# Patient Record
Sex: Female | Born: 1985 | Hispanic: No | Marital: Single | Smoking: Never smoker
Health system: Southern US, Community
[De-identification: ages and names within clinical notes are randomized; demographics above are authoritative.]

## PROBLEM LIST (undated history)

## (undated) HISTORY — PX: ESOPHAGOGASTRODUODENOSCOPY ENDOSCOPY: SHX5814

## (undated) HISTORY — PX: OTHER SURGICAL HISTORY: SHX169

---

## 2014-11-08 ENCOUNTER — Inpatient Hospital Stay (HOSPITAL_COMMUNITY)
Admission: EM | Admit: 2014-11-08 | Discharge: 2014-11-12 | DRG: 920 | Disposition: A | Discharge status: Home / Self Care | Payer: Self-pay | Attending: Internal Medicine | Admitting: Internal Medicine

## 2014-11-08 ENCOUNTER — Encounter (HOSPITAL_COMMUNITY): Payer: Self-pay

## 2014-11-08 ENCOUNTER — Emergency Department (HOSPITAL_COMMUNITY): Payer: Self-pay

## 2014-11-08 DIAGNOSIS — Z79899 Other long term (current) drug therapy: Secondary | ICD-10-CM

## 2014-11-08 DIAGNOSIS — R112 Nausea with vomiting, unspecified: Secondary | ICD-10-CM | POA: Diagnosis present

## 2014-11-08 DIAGNOSIS — D649 Anemia, unspecified: Secondary | ICD-10-CM | POA: Diagnosis present

## 2014-11-08 DIAGNOSIS — K259 Gastric ulcer, unspecified as acute or chronic, without hemorrhage or perforation: Secondary | ICD-10-CM | POA: Diagnosis present

## 2014-11-08 DIAGNOSIS — T85598A Other mechanical complication of other gastrointestinal prosthetic devices, implants and grafts, initial encounter: Principal | ICD-10-CM | POA: Diagnosis present

## 2014-11-08 DIAGNOSIS — Z6825 Body mass index (BMI) 25.0-25.9, adult: Secondary | ICD-10-CM

## 2014-11-08 DIAGNOSIS — R1013 Epigastric pain: Secondary | ICD-10-CM | POA: Diagnosis present

## 2014-11-08 DIAGNOSIS — Y838 Other surgical procedures as the cause of abnormal reaction of the patient, or of later complication, without mention of misadventure at the time of the procedure: Secondary | ICD-10-CM | POA: Diagnosis present

## 2014-11-08 DIAGNOSIS — R109 Unspecified abdominal pain: Secondary | ICD-10-CM | POA: Diagnosis present

## 2014-11-08 DIAGNOSIS — K4090 Unilateral inguinal hernia, without obstruction or gangrene, not specified as recurrent: Secondary | ICD-10-CM | POA: Diagnosis present

## 2014-11-08 DIAGNOSIS — Q600 Renal agenesis, unilateral: Secondary | ICD-10-CM

## 2014-11-08 LAB — URINALYSIS, ROUTINE W REFLEX MICROSCOPIC
Glucose, UA: NEGATIVE mg/dL
Hgb urine dipstick: NEGATIVE
KETONES UR: 40 mg/dL — AB
LEUKOCYTES UA: NEGATIVE
NITRITE: NEGATIVE
PROTEIN: NEGATIVE mg/dL
Specific Gravity, Urine: 1.022 (ref 1.005–1.030)
Urobilinogen, UA: 1 mg/dL (ref 0.0–1.0)
pH: 5.5 (ref 5.0–8.0)

## 2014-11-08 LAB — HEPATIC FUNCTION PANEL
ALBUMIN: 4 g/dL (ref 3.5–5.0)
ALK PHOS: 105 U/L (ref 38–126)
ALT: 12 U/L — AB (ref 14–54)
AST: 16 U/L (ref 15–41)
Bilirubin, Direct: 0.1 mg/dL (ref 0.1–0.5)
Indirect Bilirubin: 0.6 mg/dL (ref 0.3–0.9)
TOTAL PROTEIN: 7.6 g/dL (ref 6.5–8.1)
Total Bilirubin: 0.7 mg/dL (ref 0.3–1.2)

## 2014-11-08 LAB — I-STAT CHEM 8, ED
BUN: 9 mg/dL (ref 6–20)
CHLORIDE: 103 mmol/L (ref 101–111)
Calcium, Ion: 1.14 mmol/L (ref 1.12–1.23)
Creatinine, Ser: 0.9 mg/dL (ref 0.44–1.00)
Glucose, Bld: 86 mg/dL (ref 65–99)
HEMATOCRIT: 48 % — AB (ref 36.0–46.0)
HEMOGLOBIN: 16.3 g/dL — AB (ref 12.0–15.0)
POTASSIUM: 4.1 mmol/L (ref 3.5–5.1)
Sodium: 140 mmol/L (ref 135–145)
TCO2: 23 mmol/L (ref 0–100)

## 2014-11-08 LAB — CBC
HCT: 43.7 % (ref 36.0–46.0)
HEMOGLOBIN: 15.3 g/dL — AB (ref 12.0–15.0)
MCH: 28.3 pg (ref 26.0–34.0)
MCHC: 35 g/dL (ref 30.0–36.0)
MCV: 80.8 fL (ref 78.0–100.0)
PLATELETS: 298 10*3/uL (ref 150–400)
RBC: 5.41 MIL/uL — AB (ref 3.87–5.11)
RDW: 12.4 % (ref 11.5–15.5)
WBC: 5.8 10*3/uL (ref 4.0–10.5)

## 2014-11-08 LAB — I-STAT BETA HCG BLOOD, ED (MC, WL, AP ONLY): I-stat hCG, quantitative: <5 m[IU]/mL (ref <5)

## 2014-11-08 LAB — LIPASE, BLOOD: Lipase: 16 U/L — ABNORMAL LOW (ref 22–51)

## 2014-11-08 MED ORDER — ONDANSETRON HCL 4 MG/2ML IJ SOLN
4.0000 mg | Freq: Once | INTRAMUSCULAR | Status: AC
  Administered 2014-11-08: 4 mg via INTRAVENOUS
  Filled 2014-11-08: qty 2

## 2014-11-08 MED ORDER — IOHEXOL 300 MG/ML  SOLN
100.0000 mL | Freq: Once | INTRAMUSCULAR | Status: AC | PRN
  Administered 2014-11-08: 100 mL via INTRAVENOUS

## 2014-11-08 MED ORDER — SODIUM CHLORIDE 0.9 % IV BOLUS (SEPSIS)
1000.0000 mL | Freq: Once | INTRAVENOUS | Status: AC
  Administered 2014-11-08: 1000 mL via INTRAVENOUS

## 2014-11-08 MED ORDER — MORPHINE SULFATE (PF) 4 MG/ML IV SOLN
4.0000 mg | Freq: Once | INTRAVENOUS | Status: AC
  Administered 2014-11-08: 4 mg via INTRAVENOUS
  Filled 2014-11-08: qty 1

## 2014-11-08 NOTE — ED Notes (Signed)
Unsuccessful at getting labs 

## 2014-11-08 NOTE — ED Notes (Signed)
Nurse starting IV 

## 2014-11-08 NOTE — ED Notes (Addendum)
Patient reports that she has a gastric balloon for weight loss. Patient states she has had epigastric pain and nausea x 2 weeks, but severe over the past 4 days.

## 2014-11-08 NOTE — ED Provider Notes (Signed)
CSN: [id number]     Arrival date & time 11/08/14  1820 History   First MD Initiated Contact with Patient 11/08/14 2000     Chief Complaint  Patient presents with  . Abdominal Pain     (Consider location/radiation/quality/duration/timing/severity/associated sxs/prior Treatment) HPI   29 year old female with history of prior gastric balloon for weight loss presenting for evaluation of abdominal pain. Patient reports she had gastric balloon procedure out of state at [place] 2 years ago for weight loss. She was told that if she has any abdominal pain then she may need to have her balloon removed. For the past 2 weeks patient has reports gradual onset of persistent sharp achy pain to the epigastrium, nonradiating, with associate nausea and vomiting. Vomitus usually after eating or drinking. For the past 4 days her symptoms has been getting progressively worse. She is unable to eat or drink anything. She denies having fever, chills, headache, chest pain, shortness of breath, productive cough, hemoptysis, back pain, dysuria, vaginal bleeding, vaginal discharge, diarrhea, or rash. She is not a drinker and denies history of diabetes. Her pain is currently moderate in intensity. She has tried taking pantoprazole, and metoclopramide in for her symptoms without adequate relief.  History reviewed. No pertinent past medical history. Past Surgical History  Procedure Laterality Date  . Gastric balloon    . Esophagogastroduodenoscopy endoscopy     History reviewed. No pertinent family history. Social History  Substance Use Topics  . Smoking status: Never Smoker   . Smokeless tobacco: Never Used  . Alcohol Use: No   OB History    No data available     Review of Systems  All other systems reviewed and are negative.     Allergies  Review of patient's allergies indicates no known allergies.  Home Medications   Prior to Admission medications   Medication Sig Start Date End Date Taking?  Authorizing Provider  metoCLOPramide (REGLAN) 10 MG tablet Take 10 mg by mouth every 6 (six) hours as needed for nausea or vomiting.   Yes Historical Provider, MD  pantoprazole (PROTONIX) 20 MG tablet Take 20 mg by mouth daily.   Yes Historical Provider, MD   BP 143/92 mmHg  Pulse 87  Temp(Src) 98.3 F (36.8 C) (Oral)  Resp 18  Ht 5' 3.78" (1.62 m)  Wt 145 lb 8.1 oz (66 kg)  BMI 25.15 kg/m2  SpO2 100%  LMP 11/04/2014 Physical Exam  Constitutional: She appears well-developed and well-nourished. No distress.  HENT:  Head: Atraumatic.  Eyes: Conjunctivae are normal.  Neck: Neck supple.  Cardiovascular: Normal rate and regular rhythm.   Pulmonary/Chest: Effort normal and breath sounds normal. She exhibits no tenderness.  Abdominal: Soft. There is tenderness (Tenderness to epigastric without guarding or rebound tenderness. Otherwise abdomen is soft, bowel sounds present. Negative Murphy sign, no pain at McBurney's point.).  Neurological: She is alert.  Skin: No rash noted.  Psychiatric: She has a normal mood and affect.  Nursing note and vitals reviewed.   ED Course  Procedures (including critical care time)  Epigastric abdominal pain likely secondary to a gastric balloon. Workup initiated, CT scan ordered. Anticipate consultation with GI for further management.  12:37 AM Coronal and pelvis CT scan demonstrated a marked distention of the gastric balloon with distention of the stomach and mass effect on the adjacent small bowel and mesentery. No evidence of obstruction noted. Patient continues to endorse pain minimally improved after receiving morphine. Additional pain medication given. The remainder of her  labs are reassuring. Plan to consult GI for further management.  12:46 AM Appreciate consultation from GI Dr. Elnoria Howard who recommend consulting General Surgery for further management as he has never remove gastric balloon in the past and unable to assist.  Will consult General Surgery.     1:04 AM I have consulted with general surgeon Dr. Ricky Stabs, who plan to discussed with his partner Dr. Daphine Deutscher in the morning to explore options of removal of the balloon. Plan to consult medicine for admission.    1:19 AM i have consulted with [facility] Dr. Toniann Fail who agrees to admit pt to med surg for pain management.  Dr. Georgana Curio will see and evaluate pt and determine further care.   Labs Review Labs Reviewed  CBC - Abnormal; Notable for the following:    RBC 5.41 (*)    Hemoglobin 15.3 (*)    All other components within normal limits  URINALYSIS, ROUTINE W REFLEX MICROSCOPIC (NOT AT [facility]) - Abnormal; Notable for the following:    Color, Urine AMBER (*)    Bilirubin Urine SMALL (*)    Ketones, ur 40 (*)    All other components within normal limits  HEPATIC FUNCTION PANEL - Abnormal; Notable for the following:    ALT 12 (*)    All other components within normal limits  LIPASE, BLOOD - Abnormal; Notable for the following:    Lipase 16 (*)    All other components within normal limits  I-STAT CHEM 8, ED - Abnormal; Notable for the following:    Hemoglobin 16.3 (*)    HCT 48.0 (*)    All other components within normal limits  I-STAT BETA HCG BLOOD, ED ([facility], [facility], [facility] ONLY)    Imaging Review Ct Abdomen Pelvis W Contrast  11/09/2014   CLINICAL DATA:  29 year old female. Epigastric pain and nausea x2 weeks. Patient reports he gastric balloon for weight loss.  EXAM: CT ABDOMEN AND PELVIS WITH CONTRAST  TECHNIQUE: Multidetector CT imaging of the abdomen and pelvis was performed using the standard protocol following bolus administration of intravenous contrast.  CONTRAST:  OMNIPAQUE IOHEXOL 300 MG/ML  SOLN  COMPARISON:  None.  FINDINGS: The visualized lung bases are clear. No intra-abdominal free air. Trace free fluid within the pelvis.  The liver, gallbladder, pancreas, spleen, adrenal glands appear unremarkable. 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  The right kidney is mildly enlarged with lobulated contour. This may be related to compensatory hypertrophy. The crossed fused ectopia is less likely. There is mild right hydronephrosis. The visualized right ureter appears unremarkable. 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 The urinary bladder is unremarkable. The uterus is not visualized and may be surgically absent.  A gastric balloon is noted within the distal stomach. Contrast however is noted passing through the stomach into the small bowel without evidence of obstruction. There is marked distention of the gastric balloon with air fluid level. There is distention of the stomach with compression of the adjacent small bowel and mesenteric vasculature. Copious amount of stool noted throughout the colon. The appendix is unremarkable.  The visualized abdominal aorta and IVC appear unremarkable. No portal venous gas identified. There is no lymphadenopathy. An ovoid soft tissue density containing interspersed fat noted extending into the right inguinal canal. There is no definite associated inflammatory changes. Ultrasound may provide better evaluation of this tissue and right inguinal hernia.  Midline vertical anterior pelvic  wall incisional scar is noted. There is mild diffuse subcutaneous soft tissue stranding. The osseous structures are grossly unremarkable.  IMPRESSION: Marked distention of the gastric balloon with distention of the stomach and mass-effect on the adjacent small bowel and mesentery. No evidence of bowel obstruction. Constipation. Normal appendix.  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 There is mild fullness of the right renal collecting system and pelvis.  Right inguinal hernia containing an ovoid soft tissue with interspersed fat. Correlation with clinical exam and ultrasound recommended.   Electronically Signed   By:  Elgie Collard M.D.   On: 11/09/2014 00:06   I have personally reviewed and evaluated these images and lab results as part of my medical decision-making.   EKG Interpretation None      MDM   Final diagnoses:  Epigastric abdominal pain    BP 126/73 mmHg  Pulse 61  Temp(Src) 98.3 F (36.8 C) (Oral)  Resp 14  Ht 5' 3.78" (1.62 m)  Wt 145 lb 8.1 oz (66 kg)  BMI 25.15 kg/m2  SpO2 100%  LMP 11/04/2014     Fayrene Helper, PA-C 11/09/14 0120  Donnetta Hutching, MD 11/11/14 [phone]

## 2014-11-09 ENCOUNTER — Encounter (HOSPITAL_COMMUNITY): Payer: Self-pay | Admitting: *Deleted

## 2014-11-09 ENCOUNTER — Other Ambulatory Visit: Payer: Self-pay | Admitting: General Surgery

## 2014-11-09 DIAGNOSIS — R1013 Epigastric pain: Secondary | ICD-10-CM | POA: Diagnosis present

## 2014-11-09 DIAGNOSIS — R109 Unspecified abdominal pain: Secondary | ICD-10-CM | POA: Diagnosis present

## 2014-11-09 LAB — CBC WITH DIFFERENTIAL/PLATELET
BASOS ABS: 0 10*3/uL (ref 0.0–0.1)
BASOS PCT: 0 %
EOS ABS: 0.1 10*3/uL (ref 0.0–0.7)
Eosinophils Relative: 2 %
HEMATOCRIT: 38 % (ref 36.0–46.0)
HEMOGLOBIN: 12.8 g/dL (ref 12.0–15.0)
Lymphocytes Relative: 57 %
Lymphs Abs: 3.4 10*3/uL (ref 0.7–4.0)
MCH: 27.4 pg (ref 26.0–34.0)
MCHC: 33.7 g/dL (ref 30.0–36.0)
MCV: 81.4 fL (ref 78.0–100.0)
MONOS PCT: 6 %
Monocytes Absolute: 0.4 10*3/uL (ref 0.1–1.0)
NEUTROS ABS: 2 10*3/uL (ref 1.7–7.7)
NEUTROS PCT: 34 %
Platelets: 288 10*3/uL (ref 150–400)
RBC: 4.67 MIL/uL (ref 3.87–5.11)
RDW: 12.3 % (ref 11.5–15.5)
WBC: 5.9 10*3/uL (ref 4.0–10.5)

## 2014-11-09 LAB — COMPREHENSIVE METABOLIC PANEL
ALK PHOS: 83 U/L (ref 38–126)
ALT: 9 U/L — AB (ref 14–54)
ANION GAP: 7 (ref 5–15)
AST: 10 U/L — ABNORMAL LOW (ref 15–41)
Albumin: 3.1 g/dL — ABNORMAL LOW (ref 3.5–5.0)
BILIRUBIN TOTAL: 0.8 mg/dL (ref 0.3–1.2)
BUN: 9 mg/dL (ref 6–20)
CALCIUM: 8.6 mg/dL — AB (ref 8.9–10.3)
CO2: 24 mmol/L (ref 22–32)
CREATININE: 0.82 mg/dL (ref 0.44–1.00)
Chloride: 108 mmol/L (ref 101–111)
Glucose, Bld: 92 mg/dL (ref 65–99)
Potassium: 3.9 mmol/L (ref 3.5–5.1)
SODIUM: 139 mmol/L (ref 135–145)
TOTAL PROTEIN: 5.9 g/dL — AB (ref 6.5–8.1)

## 2014-11-09 LAB — GLUCOSE, CAPILLARY
GLUCOSE-CAPILLARY: 83 mg/dL (ref 65–99)
GLUCOSE-CAPILLARY: 84 mg/dL (ref 65–99)

## 2014-11-09 MED ORDER — ONDANSETRON HCL 4 MG PO TABS: 4.0000 mg | ORAL_TABLET | Freq: Four times a day (QID) | ORAL | Status: DC | PRN

## 2014-11-09 MED ORDER — MORPHINE SULFATE (PF) 4 MG/ML IV SOLN
4.0000 mg | Freq: Once | INTRAVENOUS | Status: AC
  Administered 2014-11-09: 4 mg via INTRAVENOUS
  Filled 2014-11-09: qty 1

## 2014-11-09 MED ORDER — ONDANSETRON HCL 4 MG/2ML IJ SOLN: 4.0000 mg | Freq: Three times a day (TID) | INTRAMUSCULAR | Status: DC | PRN

## 2014-11-09 MED ORDER — ACETAMINOPHEN 325 MG PO TABS
650.0000 mg | ORAL_TABLET | Freq: Four times a day (QID) | ORAL | Status: DC | PRN
  Filled 2014-11-09: qty 2

## 2014-11-09 MED ORDER — PANTOPRAZOLE SODIUM 40 MG IV SOLR
40.0000 mg | Freq: Once | INTRAVENOUS | Status: AC
  Administered 2014-11-09: 40 mg via INTRAVENOUS
  Filled 2014-11-09: qty 40

## 2014-11-09 MED ORDER — ONDANSETRON HCL 4 MG/2ML IJ SOLN
4.0000 mg | Freq: Four times a day (QID) | INTRAMUSCULAR | Status: DC | PRN
  Administered 2014-11-09 – 2014-11-10 (×4): 4 mg via INTRAVENOUS
  Filled 2014-11-09 (×4): qty 2

## 2014-11-09 MED ORDER — MORPHINE SULFATE (PF) 2 MG/ML IV SOLN
1.0000 mg | INTRAVENOUS | Status: DC | PRN
  Administered 2014-11-09 – 2014-11-10 (×6): 1 mg via INTRAVENOUS
  Filled 2014-11-09 (×6): qty 1

## 2014-11-09 MED ORDER — ONDANSETRON HCL 4 MG PO TABS: 4.0000 mg | ORAL_TABLET | Freq: Three times a day (TID) | ORAL | Status: DC | PRN

## 2014-11-09 MED ORDER — PANTOPRAZOLE SODIUM 40 MG IV SOLR
40.0000 mg | INTRAVENOUS | Status: DC
  Administered 2014-11-09 – 2014-11-10 (×2): 40 mg via INTRAVENOUS
  Filled 2014-11-09 (×2): qty 40

## 2014-11-09 MED ORDER — HYDROCODONE-ACETAMINOPHEN 5-325 MG PO TABS: 1.0000 | ORAL_TABLET | Freq: Four times a day (QID) | ORAL | Status: DC | PRN

## 2014-11-09 MED ORDER — HYDROMORPHONE HCL 1 MG/ML IJ SOLN: 1.0000 mg | INTRAMUSCULAR | Status: DC | PRN

## 2014-11-09 MED ORDER — IBUPROFEN 800 MG PO TABS
800.0000 mg | ORAL_TABLET | Freq: Three times a day (TID) | ORAL | Status: DC
Start: 2014-11-09 — End: 2014-11-09

## 2014-11-09 MED ORDER — DEXTROSE-NACL 5-0.9 % IV SOLN
INTRAVENOUS | Status: AC
  Administered 2014-11-09 (×2): via INTRAVENOUS
  Administered 2014-11-10: 1000 mL via INTRAVENOUS

## 2014-11-09 MED ORDER — ACETAMINOPHEN 650 MG RE SUPP
650.0000 mg | Freq: Four times a day (QID) | RECTAL | Status: DC | PRN
  Administered 2014-11-10: 650 mg via RECTAL
  Filled 2014-11-09: qty 1

## 2014-11-09 NOTE — H&P (Signed)
[facility] History and Physical  Jessica Sexton ZOX:[id number] DOB: February 02, 1986 DOA: 11/08/2014  Referring physician: Mr. Greta Doom. PCP: No primary care provider on file.  Specialists: None.  Chief Complaint: Abdominal pain.  HPI: Jessica Sexton is a 29 y.o. female with no significant past medical history who has had previous history of placement of gastric balloon for obesity 2 years ago at [place] presents to the ER because of abdominal pain over the last 2 weeks which has gradually worsened. Pain is mostly in the epigastric area with nausea and vomiting and unable to eat much. CT abdomen and pelvis shows a large balloon compressing on adjacent structures. On-call surgeon Dr. Gerrit Friends was consulted by ER physician and will be seeing patient in consult. Patient's pain is improved with Dilaudid. Patient otherwise denies any diarrhea or fever chills.  Review of Systems: As presented in the history of presenting illness, rest negative.  History reviewed. No pertinent past medical history. Past Surgical History  Procedure Laterality Date  . Gastric balloon    . Esophagogastroduodenoscopy endoscopy     Social History:  reports that she has never smoked. She has never used smokeless tobacco. She reports that she does not drink alcohol or use illicit drugs. Where does patient live home. Can patient participate in ADLs? Yes.  No Known Allergies  Family History:  Family History  Problem Relation Age of Onset  . Diabetes Mellitus II Neg Hx   . Hypertension Neg Hx       Prior to Admission medications   Medication Sig Start Date End Date Taking? Authorizing Provider  metoCLOPramide (REGLAN) 10 MG tablet Take 10 mg by mouth every 6 (six) hours as needed for nausea or vomiting.   Yes Historical Provider, MD  pantoprazole (PROTONIX) 20 MG tablet Take 20 mg by mouth daily.   Yes Historical Provider, MD    Physical Exam: Filed Vitals:   11/08/14 1826 11/09/14 0055 11/09/14 0208  BP:  143/92 126/73 150/94  Pulse: 87 61 58  Temp: 98.3 F (36.8 C)  97.8 F (36.6 C)  TempSrc: Oral  Oral  Resp: Height: 5' 3.78" (1.62 m)   (1.6 m)  Weight: 66 kg (145 lb 8.1 oz)  71.5 kg (157 lb 10.1 oz)  SpO2: 100% 100% 100%     General:  Moderately built and nourished.  Eyes: Anicteric no pallor.  ENT: No discharge from the ears eyes nose or mouth.  Neck: No mass felt.  Cardiovascular: S1 and S2 heard.  Respiratory: No rhonchi or crepitations.  Abdomen: Mildly distended. No guarding or rigidity. Bowel sounds are appreciated.  Skin: No rash.  Musculoskeletal: No edema.  Psychiatric: Appears normal.  Neurologic: Alert awake oriented to time place and person. Moves all extremities.  Labs on Admission:  Basic Metabolic Panel:  Recent Labs Lab 11/08/14 2115  NA 140  K 4.1  CL 103  GLUCOSE 86  BUN 9  CREATININE 0.90   Liver Function Tests:  Recent Labs Lab 11/08/14 2107  AST 16  ALT 12*  ALKPHOS 105  BILITOT 0.7  PROT 7.6  ALBUMIN 4.0    Recent Labs Lab 11/08/14 2107  LIPASE 16*   No results for input(s): AMMONIA in the last 168 hours. CBC:  Recent Labs Lab 11/08/14 1914 11/08/14 2115  WBC 5.8  --   HGB 15.3* 16.3*  HCT 43.7 48.0*  MCV 80.8  --   PLT 298  --    Cardiac Enzymes: No results  for input(s): CKTOTAL, CKMB, CKMBINDEX, TROPONINI in the last 168 hours.  BNP (last 3 results) No results for input(s): BNP in the last 8760 hours.  ProBNP (last 3 results) No results for input(s): PROBNP in the last 8760 hours.  CBG: No results for input(s): GLUCAP in the last 168 hours.  Radiological Exams on Admission: Ct Abdomen Pelvis W Contrast  11/09/2014   CLINICAL DATA:  29 year old female. Epigastric pain and nausea x2 weeks. Patient reports he gastric balloon for weight loss.  EXAM: CT ABDOMEN AND PELVIS WITH CONTRAST  TECHNIQUE: Multidetector CT imaging of the abdomen and pelvis was performed using the standard protocol  following bolus administration of intravenous contrast.  CONTRAST:  OMNIPAQUE IOHEXOL 300 MG/ML  SOLN  COMPARISON:  None.  FINDINGS: The visualized lung bases are clear. No intra-abdominal free air. Trace free fluid within the pelvis.  The liver, gallbladder, pancreas, spleen, adrenal glands appear unremarkable. 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 The right kidney is mildly enlarged with lobulated contour. This may be related to compensatory hypertrophy. The crossed fused ectopia is less likely. There is mild right hydronephrosis. The visualized right ureter appears unremarkable. 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 The urinary bladder is unremarkable. The uterus is not visualized and may be surgically absent.  A gastric balloon is noted within the distal stomach. Contrast however is noted passing through the stomach into the small bowel without evidence of obstruction. There is marked distention of the gastric balloon with air fluid level. There is distention of the stomach with compression of the adjacent small bowel and mesenteric vasculature. Copious amount of stool noted throughout the colon. The appendix is unremarkable.  The visualized abdominal aorta and IVC appear unremarkable. No portal venous gas identified. There is no lymphadenopathy. An ovoid soft tissue density containing interspersed fat noted extending into the right inguinal canal. There is no definite associated inflammatory changes. Ultrasound may provide better evaluation of this tissue and right inguinal hernia.  Midline vertical anterior pelvic wall incisional scar is noted. There is mild diffuse subcutaneous soft tissue stranding. The osseous structures are grossly unremarkable.  IMPRESSION: Marked distention of the gastric balloon with distention of the stomach and mass-effect on the adjacent small bowel and mesentery. No  evidence of bowel obstruction. Constipation. Normal appendix.  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 There is mild fullness of the right renal collecting system and pelvis.  Right inguinal hernia containing an ovoid soft tissue with interspersed fat. Correlation with clinical exam and ultrasound recommended.   Electronically Signed   By: Elgie Collard M.D.   On: 11/09/2014 00:06     Assessment/Plan Principal Problem:   Epigastric abdominal pain Active Problems:   Abdominal pain   1. Epigastric abdominal pain - probably from marked distention of the gastric balloon with mass effect on the adjacent structures. At this time I have placed patient on IV fluids pain relief medications and nothing by mouth. On call surgeon Dr. Gerrit Friends has been consulted and we will follow their recommendations. 2. Solitary kidney - follow creatinine closely. 3. Right inguinal hernia - for surgery.   DVT Prophylaxis SCDs.  Code Status: Full code.  Family Communication: Discussed with patient.  Disposition Plan: Admit to inpatient.    Stevon Gough N. [facility] Pager [phone].  If 7PM-7AM, please contact night-coverage [email or web address] Password [email or web address] 11/09/2014, 4:11 AM

## 2014-11-09 NOTE — Progress Notes (Signed)
PATIENT DETAILS Name: Jessica Sexton Age: 29 y.o. Sex: female Date of Birth: [birth year] Admit Date: 11/08/2014 Admitting Physician Eduard Clos, MD PCP:No primary care provider on file.  Brief Narrative [name] is a 29 y.o. female with no significant past medical history who has had previous history of placement of gastric balloon for obesity 2 years ago at [place] presented to the ER because of abdominal pain. Further evaluation with a CT Abd revealed marked distention of the gastric balloon with mass effect on the adjacent structures.  Subjective: Continues to have epigastric pain-but somewhat improved than yesterday  Assessment/Plan: Principal Problem: Epigastric abdominal pain:secondary from marked distention of the gastric balloon with mass effect on the adjacent structures.Gen surgery consulted. NPO and supportive care for now  Active Problems Solitary kidney: follow creatinine closely.  Right inguinal hernia :incidental finding-gen surgery to see  Disposition: Remain inpatient  Antimicrobial agents  See below  Anti-infectives    None      DVT Prophylaxis: SCD's  Code Status: Full code  Family Communication None at bedside  Procedures:   CONSULTS:  general surgery  Time spent 20 minutes-Greater than 50% of this time was spent in counseling, explanation of diagnosis, planning of further management, and coordination of care.  MEDICATIONS: Scheduled Meds:  Continuous Infusions: . dextrose 5 % and 0.9% NaCl 100 mL/hr at 11/09/14 0430   PRN Meds:.acetaminophen **OR** acetaminophen, morphine injection, ondansetron **OR** ondansetron (ZOFRAN) IV    PHYSICAL EXAM: Vital signs in last 24 hours: Filed Vitals:   11/08/14 1826 11/09/14 0055 11/09/14 0208 11/09/14 0701  BP: 143/92 126/73 150/94 117/76  Pulse: 87 61 58 66  Temp: 98.3 F (36.8 C)  97.8 F (36.6 C) 98.2 F (36.8 C)  TempSrc: Oral  Oral Oral  Resp: 18  14 16 16   Height: 5' 3.78" (1.62 m)  5\' 3"  (1.6 m)   Weight: 66 kg (145 lb 8.1 oz)  71.5 kg (157 lb 10.1 oz)   SpO2: 100% 100% 100% 100%    Weight change:  Filed Weights   11/08/14 1826 11/09/14 0208  Weight: 66 kg (145 lb 8.1 oz) 71.5 kg (157 lb 10.1 oz)   Body mass index is 27.93 kg/(m^2).   Gen Exam: Awake and alert with clear speech.  Neck: Supple, No JVD.   Chest: B/L Clear.   CVS: S1 S2 Regular, no murmurs.  Abdomen: soft, BS +, mildly tender in epigastrium, non distended.  Extremities: no edema, lower extremities warm to touch. Neurologic: Non Focal.   Skin: No Rash.   Wounds: N/A.    Intake/Output from previous day: No intake or output data in the 24 hours ending 11/09/14 1042   LAB RESULTS: CBC  Recent Labs Lab 11/08/14 1914 11/08/14 2115 11/09/14 0600  WBC 5.8  --  5.9  HGB 15.3* 16.3* 12.8  HCT 43.7 48.0* 38.0  PLT 298  --  288  MCV 80.8  --  81.4  MCH 28.3  --  27.4  MCHC 35.0  --  33.7  RDW 12.4  --  12.3  LYMPHSABS  --   --  3.4  MONOABS  --   --  0.4  EOSABS  --   --  0.1  BASOSABS  --   --  0.0    Chemistries   Recent Labs Lab 11/08/14 2115 11/09/14 0600  NA 140 139  K 4.1 3.9  CL  103 108  CO2  --  24  GLUCOSE 86 92  BUN 9 9  CREATININE 0.90 0.82  CALCIUM  --  8.6*    CBG:  Recent Labs Lab 11/09/14 0655  GLUCAP 83    GFR Estimated Creatinine Clearance: 97.6 mL/min (by C-G formula based on Cr of 0.82).  Coagulation profile No results for input(s): INR, PROTIME in the last 168 hours.  Cardiac Enzymes No results for input(s): CKMB, TROPONINI, MYOGLOBIN in the last 168 hours.  Invalid input(s): CK  Invalid input(s): POCBNP No results for input(s): DDIMER in the last 72 hours. No results for input(s): HGBA1C in the last 72 hours. No results for input(s): CHOL, HDL, LDLCALC, TRIG, CHOLHDL, LDLDIRECT in the last 72 hours. No results for input(s): TSH, T4TOTAL, T3FREE, THYROIDAB in the last 72 hours.  Invalid input(s):  FREET3 No results for input(s): VITAMINB12, FOLATE, FERRITIN, TIBC, IRON, RETICCTPCT in the last 72 hours.  Recent Labs  11/08/14 2107  LIPASE 16*    Urine Studies No results for input(s): UHGB, CRYS in the last 72 hours.  Invalid input(s): UACOL, UAPR, USPG, UPH, UTP, UGL, UKET, UBIL, UNIT, UROB, ULEU, UEPI, UWBC, URBC, UBAC, CAST, UCOM, BILUA  MICROBIOLOGY: No results found for this or any previous visit (from the past 240 hour(s)).  RADIOLOGY STUDIES/RESULTS: Ct Abdomen Pelvis W Contrast  11/09/2014   CLINICAL DATA:  29 year old female. Epigastric pain and nausea x2 weeks. Patient reports he gastric balloon for weight loss.  EXAM: CT ABDOMEN AND PELVIS WITH CONTRAST  TECHNIQUE: Multidetector CT imaging of the abdomen and pelvis was performed using the standard protocol following bolus administration of intravenous contrast.  CONTRAST:  OMNIPAQUE IOHEXOL 300 MG/ML  SOLN  COMPARISON:  None.  FINDINGS: The visualized lung bases are clear. No intra-abdominal free air. Trace free fluid within the pelvis.  The liver, gallbladder, pancreas, spleen, adrenal glands appear unremarkable. 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 The right kidney is mildly enlarged with lobulated contour. This may be related to compensatory hypertrophy. The crossed fused ectopia is less likely. There is mild right hydronephrosis. The visualized right ureter appears unremarkable. 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 The urinary bladder is unremarkable. The uterus is not visualized and may be surgically absent.  A gastric balloon is noted within the distal stomach. Contrast however is noted passing through the stomach into the small bowel without evidence of obstruction. There is marked distention of the gastric balloon with air fluid level. There is distention of the stomach with compression of the adjacent small  bowel and mesenteric vasculature. Copious amount of stool noted throughout the colon. The appendix is unremarkable.  The visualized abdominal aorta and IVC appear unremarkable. No portal venous gas identified. There is no lymphadenopathy. An ovoid soft tissue density containing interspersed fat noted extending into the right inguinal canal. There is no definite associated inflammatory changes. Ultrasound may provide better evaluation of this tissue and right inguinal hernia.  Midline vertical anterior pelvic wall incisional scar is noted. There is mild diffuse subcutaneous soft tissue stranding. The osseous structures are grossly unremarkable.  IMPRESSION: Marked distention of the gastric balloon with distention of the stomach and mass-effect on the adjacent small bowel and mesentery. No evidence of bowel obstruction. Constipation. Normal appendix.  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 There is mild fullness of the right renal collecting system and pelvis.  Right inguinal hernia containing an ovoid soft tissue with interspersed fat. Correlation with clinical exam and ultrasound recommended.   Electronically Signed   By: Elgie Collard M.D.   On: 11/09/2014 00:06    Jeoffrey Massed, MD  [facility] Pager:[phone]  If 7PM-7AM, please contact night-coverage [email or web address] Password [email or web address] 11/09/2014, 10:42 AM   LOS: 0 days

## 2014-11-09 NOTE — Progress Notes (Signed)
New Admission Note:   Arrival Method: via stretcher from ED Mental Orientation: Alert and oriented x4 Assessment: Completed Skin: Intact, warm, and dry IV: Right AC Peripheral IV Normal Saline Locked Pain: Per pt tolerable at 6/10. Denies pain medication at this time Tubes: N/A Safety Measures: Educated on fall prevention safety plan, patient acknowledged and understood. Admission: Completed [facility] [address] Orientation: Patient has been orientated to the room, unit and staff.  Family: Sister at bedside.   Orders have been reviewed and implemented. Will continue to monitor the patient. Call light has been placed within reach.   Billy Fischer, RN  Phone number: [phone]

## 2014-11-09 NOTE — Progress Notes (Signed)
   11/09/14 1500  Clinical Encounter Type  Visited With Patient  Visit Type Initial;Psychological support;Spiritual support  Referral From Nurse  Consult/Referral To Chaplain  Spiritual Encounters  Spiritual Needs Emotional;Other (Comment) (Pastoral Conversation)  Stress Factors  Patient Stress Factors Health changes;Lack of knowledge   Chaplain visited with the patient per request by the patient's nurse. The nurse briefed the Chaplain on the patient's current health issues.  The patient was sitting up in her bed when the Chaplain arrived. She was tearful and appeared to be in slight physical discomfort, which she confirmed verbally to the Chaplain. She was receptive to the Chaplain's visit. The patient stated that she was of the Islamic Faith tradition, but was appreciative of any spiritual care.  The patient is finding it difficult waiting while the medical team tries to find a doctor who can help her. She states that she is bored waiting in the hospital, and nervous about whether the staff can find someone to help her.  Chaplain interventions included pastoral conversation, gathering of materials for the patient to occupy herself while in the hospital, emotional support, spiritual assessment and gifting of bone pillow.  Chaplain will follow up with the patient.

## 2014-11-09 NOTE — Consult Note (Signed)
Reason for Consult:  Abdominal pain with gastric balloon Referring Physician: Dr. [name] is an 29 y.o. female.  HPI: Pt seen in ED with 2 weeks of epigastric pain and nausea, more severe over the last 4 days.  She has not been able to eat or drink anything over the last 4 days.  She had a gastric balloon placed endoscopically in [place] 2 years ago.  She has lost 100 pounds since that time.  This is the first time she has had any issue like this. Work up shows she is afebrile, VSS.  Labs ok, UA is normal.  CT scan shows Marked distention of the gastric balloon with distention of the stomach and mass-effect on the adjacent small bowel and mesentery. No evidence of bowel obstruction. Constipation. 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 There is mild fullness of the right renal collecting system and pelvis.  History reviewed. No pertinent past medical history.  Past Surgical History  Procedure Laterality Date  . Gastric balloon    . Esophagogastroduodenoscopy endoscopy      Family History  Problem Relation Age of Onset  . Diabetes Mellitus II Neg Hx   . Hypertension Neg Hx     Social History:  reports that she has never smoked. She has never used smokeless tobacco. She reports that she does not drink alcohol or use illicit drugs.  Allergies: No Known Allergies  Prior to Admission medications   Medication Sig Start Date End Date Taking? Authorizing Provider  metoCLOPramide (REGLAN) 10 MG tablet Take 10 mg by mouth every 6 (six) hours as needed for nausea or vomiting.   Yes Historical Provider, MD  pantoprazole (PROTONIX) 20 MG tablet Take 20 mg by mouth daily.   Yes Historical Provider, MD     Results for orders placed or performed during the hospital encounter of 11/08/14 (from the past 48 hour(s))  CBC     Status: Abnormal   Collection Time: 11/08/14  7:14 PM  Result Value Ref Range   WBC 5.8 4.0 -  10.5 K/uL    Comment: WHITE COUNT CONFIRMED ON SMEAR   RBC 5.41 (H) 3.87 - 5.11 MIL/uL   Hemoglobin 15.3 (H) 12.0 - 15.0 g/dL   HCT 43.7 36.0 - 46.0 %   MCV 80.8 78.0 - 100.0 fL   MCH 28.3 26.0 - 34.0 pg   MCHC 35.0 30.0 - 36.0 g/dL   RDW 12.4 11.5 - 15.5 %   Platelets 298 150 - 400 K/uL    Comment: REPEATED TO VERIFY SPECIMEN CHECKED FOR CLOTS PLATELET COUNT CONFIRMED BY SMEAR   I-Stat beta hCG blood, ED ([facility], [facility], [facility] only)     Status: None   Collection Time: 11/08/14  7:20 PM  Result Value Ref Range   I-stat hCG, quantitative <5.0 <5 mIU/mL   Comment 3            Comment:   GEST. AGE      CONC.  (mIU/mL)   <=1 WEEK        5 - 50     2 WEEKS       50 - 500     3 WEEKS       100 - 10,000     4 WEEKS     1,000 - 30,000        FEMALE AND NON-PREGNANT FEMALE:     LESS THAN 5 mIU/mL   Urinalysis, Routine w  reflex microscopic (not at [facility])     Status: Abnormal   Collection Time: 11/08/14  8:31 PM  Result Value Ref Range   Color, Urine AMBER (A) YELLOW    Comment: BIOCHEMICALS MAY BE AFFECTED BY COLOR   APPearance CLEAR CLEAR   Specific Gravity, Urine 1.022 1.005 - 1.030   pH 5.5 5.0 - 8.0   Glucose, UA NEGATIVE NEGATIVE mg/dL   Hgb urine dipstick NEGATIVE NEGATIVE   Bilirubin Urine SMALL (A) NEGATIVE   Ketones, ur 40 (A) NEGATIVE mg/dL   Protein, ur NEGATIVE NEGATIVE mg/dL   Urobilinogen, UA 1.0 0.0 - 1.0 mg/dL   Nitrite NEGATIVE NEGATIVE   Leukocytes, UA NEGATIVE NEGATIVE    Comment: MICROSCOPIC NOT DONE ON URINES WITH NEGATIVE PROTEIN, BLOOD, LEUKOCYTES, NITRITE, OR GLUCOSE <1000 mg/dL.  Hepatic function panel     Status: Abnormal   Collection Time: 11/08/14  9:07 PM  Result Value Ref Range   Total Protein 7.6 6.5 - 8.1 g/dL   Albumin 4.0 3.5 - 5.0 g/dL   AST 16 15 - 41 U/L   ALT 12 (L) 14 - 54 U/L   Alkaline Phosphatase 105 38 - 126 U/L   Total Bilirubin 0.7 0.3 - 1.2 mg/dL   Bilirubin, Direct 0.1 0.1 - 0.5 mg/dL   Indirect Bilirubin 0.6 0.3 - 0.9 mg/dL  Lipase,  blood     Status: Abnormal   Collection Time: 11/08/14  9:07 PM  Result Value Ref Range   Lipase 16 (L) 22 - 51 U/L  I-stat chem 8, ed     Status: Abnormal   Collection Time: 11/08/14  9:15 PM  Result Value Ref Range   Sodium 140 135 - 145 mmol/L   Potassium 4.1 3.5 - 5.1 mmol/L   Chloride 103 101 - 111 mmol/L   BUN 9 6 - 20 mg/dL   Creatinine, Ser 0.90 0.44 - 1.00 mg/dL   Glucose, Bld 86 65 - 99 mg/dL   Calcium, Ion 1.14 1.12 - 1.23 mmol/L   TCO2 23 0 - 100 mmol/L   Hemoglobin 16.3 (H) 12.0 - 15.0 g/dL   HCT 48.0 (H) 36.0 - 46.0 %  Comprehensive metabolic panel     Status: Abnormal   Collection Time: 11/09/14  6:00 AM  Result Value Ref Range   Sodium 139 135 - 145 mmol/L   Potassium 3.9 3.5 - 5.1 mmol/L   Chloride 108 101 - 111 mmol/L   CO2 24 22 - 32 mmol/L   Glucose, Bld 92 65 - 99 mg/dL   BUN 9 6 - 20 mg/dL   Creatinine, Ser 0.82 0.44 - 1.00 mg/dL   Calcium 8.6 (L) 8.9 - 10.3 mg/dL   Total Protein 5.9 (L) 6.5 - 8.1 g/dL   Albumin 3.1 (L) 3.5 - 5.0 g/dL   AST 10 (L) 15 - 41 U/L   ALT 9 (L) 14 - 54 U/L   Alkaline Phosphatase 83 38 - 126 U/L   Total Bilirubin 0.8 0.3 - 1.2 mg/dL   GFR calc non Af Amer >60 >60 mL/min   GFR calc Af Amer >60 >60 mL/min    Comment: (NOTE) The eGFR has been calculated using the CKD EPI equation. This calculation has not been validated in all clinical situations. eGFR's persistently <60 mL/min signify possible Chronic Kidney Disease.    Anion gap 7 5 - 15  CBC WITH DIFFERENTIAL     Status: None   Collection Time: 11/09/14  6:00 AM  Result Value Ref  Range   WBC 5.9 4.0 - 10.5 K/uL   RBC 4.67 3.87 - 5.11 MIL/uL   Hemoglobin 12.8 12.0 - 15.0 g/dL    Comment: DELTA CHECK NOTED REPEATED TO VERIFY PREVIOUS RESULT ISTAT    HCT 38.0 36.0 - 46.0 %   MCV 81.4 78.0 - 100.0 fL   MCH 27.4 26.0 - 34.0 pg   MCHC 33.7 30.0 - 36.0 g/dL   RDW 12.3 11.5 - 15.5 %   Platelets 288 150 - 400 K/uL   Neutrophils Relative % 34 %   Neutro Abs 2.0 1.7 -  7.7 K/uL   Lymphocytes Relative 57 %   Lymphs Abs 3.4 0.7 - 4.0 K/uL   Monocytes Relative 6 %   Monocytes Absolute 0.4 0.1 - 1.0 K/uL   Eosinophils Relative 2 %   Eosinophils Absolute 0.1 0.0 - 0.7 K/uL   Basophils Relative 0 %   Basophils Absolute 0.0 0.0 - 0.1 K/uL  Glucose, capillary     Status: None   Collection Time: 11/09/14  6:55 AM  Result Value Ref Range   Glucose-Capillary 83 65 - 99 mg/dL    Ct Abdomen Pelvis W Contrast  11/09/2014   CLINICAL DATA:  29 year old female. Epigastric pain and nausea x2 weeks. Patient reports he gastric balloon for weight loss.  EXAM: CT ABDOMEN AND PELVIS WITH CONTRAST  TECHNIQUE: Multidetector CT imaging of the abdomen and pelvis was performed using the standard protocol following bolus administration of intravenous contrast.  CONTRAST:  171m OMNIPAQUE IOHEXOL 300 MG/ML  SOLN  COMPARISON:  None.  FINDINGS: The visualized lung bases are clear. No intra-abdominal free air. Trace free fluid within the pelvis.  The liver, gallbladder, pancreas, spleen, adrenal glands appear unremarkable. 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 The right kidney is mildly enlarged with lobulated contour. This may be related to compensatory hypertrophy. The crossed fused ectopia is less likely. There is mild right hydronephrosis. The visualized right ureter appears unremarkable. 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 The urinary bladder is unremarkable. The uterus is not visualized and may be surgically absent.  A gastric balloon is noted within the distal stomach. Contrast however is noted passing through the stomach into the small bowel without evidence of obstruction. There is marked distention of the gastric balloon with air fluid level. There is distention of the stomach with compression of the adjacent small bowel and mesenteric vasculature. Copious amount of stool noted  throughout the colon. The appendix is unremarkable.  The visualized abdominal aorta and IVC appear unremarkable. No portal venous gas identified. There is no lymphadenopathy. An ovoid soft tissue density containing interspersed fat noted extending into the right inguinal canal. There is no definite associated inflammatory changes. Ultrasound may provide better evaluation of this tissue and right inguinal hernia.  Midline vertical anterior pelvic wall incisional scar is noted. There is mild diffuse subcutaneous soft tissue stranding. The osseous structures are grossly unremarkable.  IMPRESSION: Marked distention of the gastric balloon with distention of the stomach and mass-effect on the adjacent small bowel and mesentery. No evidence of bowel obstruction. Constipation. Normal appendix.  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 There is mild fullness of the right renal collecting system and pelvis.  Right inguinal hernia containing an ovoid soft tissue with interspersed fat. Correlation with clinical exam and ultrasound recommended.   Electronically Signed   By: AAnner Crete  M.D.   On: 11/09/2014 00:06    Review of Systems  Constitutional: Negative.   HENT: Negative.   Eyes: Negative.   Respiratory: Negative.   Cardiovascular: Negative.   Gastrointestinal: Positive for nausea, vomiting, abdominal pain and constipation (x 3 days). Negative for blood in stool.  Genitourinary: Negative.   Musculoskeletal: Negative.   Skin: Negative.   Neurological: Negative.   Endo/Heme/Allergies: Negative.   Psychiatric/Behavioral: Negative.    Blood pressure 117/76, pulse 66, temperature 98.2 F (36.8 C), temperature source Oral, resp. rate 16, height 5' 3"  (1.6 m), weight 71.5 kg (157 lb 10.1 oz), last menstrual period 11/04/2014, SpO2 100 %. Physical Exam  Constitutional: She is oriented to person, place, and time. She appears well-developed and well-nourished. No  distress.  HENT:  Head: Normocephalic and atraumatic.  Nose: Nose normal.  Eyes: Conjunctivae and EOM are normal. Right eye exhibits no discharge. Left eye exhibits no discharge. No scleral icterus.  Neck: Normal range of motion. Neck supple. No JVD present. No tracheal deviation present. No thyromegaly present.  Cardiovascular: Normal rate, regular rhythm, normal heart sounds and intact distal pulses.   No murmur heard. Respiratory: Effort normal and breath sounds normal. No respiratory distress. She has no wheezes. She has no rales. She exhibits no tenderness.  GI: Soft. Bowel sounds are normal. She exhibits distension (some gastric distension). She exhibits no mass. There is tenderness. There is no rebound and no guarding.  Musculoskeletal: Normal range of motion. She exhibits no edema or tenderness.  Lymphadenopathy:    She has no cervical adenopathy.  Neurological: She is alert and oriented to person, place, and time. No cranial nerve deficit.  Skin: Skin is warm and dry. No rash noted. She is not diaphoretic. No erythema. No pallor.  Psychiatric: She has a normal mood and affect. Her behavior is normal. Judgment and thought content normal.    Assessment/Plan: Abdominal pain, nausea and vomiting with hx of endoscopically placed gastric balloon placement 2 years ago in [place].  Plan:  We don't have anyone who does this here in [place] that I know of.  The device has some kind of stem that we can see on CT, but not much information on the device.  I will try to get more info on the device.  i would ask GI to see.  This was placed endoscopically and if we could deflate or remove that way it would be nice. Otherwise it would require surgical removal of some type.   As noted she is not obstructed, so we can take some time to figure this out.  Dr. Lucia Gaskins notes that normally those are removed here in the [place] every 6 months, this one has been there for 2 years.  We will follow with you.  I  would keep her hydrated and NPO for now.  Spatz adjustable balloon system is the name of the product used.   JENNINGS,WILLARD 11/09/2014, 9:45 AM

## 2014-11-10 ENCOUNTER — Inpatient Hospital Stay (HOSPITAL_COMMUNITY): Payer: MEDICAID | Admitting: Registered Nurse

## 2014-11-10 ENCOUNTER — Encounter (HOSPITAL_COMMUNITY): Payer: Self-pay | Admitting: Registered Nurse

## 2014-11-10 ENCOUNTER — Inpatient Hospital Stay (HOSPITAL_COMMUNITY): Payer: Self-pay | Admitting: Registered Nurse

## 2014-11-10 ENCOUNTER — Encounter (HOSPITAL_COMMUNITY)
Admission: EM | Disposition: A | Discharge status: Home / Self Care | Payer: Self-pay | Source: Home / Self Care | Attending: Internal Medicine

## 2014-11-10 DIAGNOSIS — R1084 Generalized abdominal pain: Secondary | ICD-10-CM

## 2014-11-10 DIAGNOSIS — R1013 Epigastric pain: Secondary | ICD-10-CM

## 2014-11-10 HISTORY — PX: UPPER GI ENDOSCOPY: SHX6162

## 2014-11-10 HISTORY — PX: LAPAROSCOPY: SHX197

## 2014-11-10 LAB — GLUCOSE, CAPILLARY
GLUCOSE-CAPILLARY: 106 mg/dL — AB (ref 65–99)
GLUCOSE-CAPILLARY: 90 mg/dL (ref 65–99)
GLUCOSE-CAPILLARY: 97 mg/dL (ref 65–99)
Glucose-Capillary: 107 mg/dL — ABNORMAL HIGH (ref 65–99)

## 2014-11-10 LAB — SURGICAL PCR SCREEN
MRSA, PCR: NEGATIVE
Staphylococcus aureus: NEGATIVE

## 2014-11-10 SURGERY — ENDOSCOPY, UPPER GI TRACT
Anesthesia: General

## 2014-11-10 MED ORDER — ROCURONIUM BROMIDE 100 MG/10ML IV SOLN
INTRAVENOUS | Status: AC
  Filled 2014-11-10: qty 1

## 2014-11-10 MED ORDER — SUCRALFATE 1 GM/10ML PO SUSP
1.0000 g | Freq: Three times a day (TID) | ORAL | Status: DC
  Administered 2014-11-10 – 2014-11-12 (×7): 1 g via ORAL
  Filled 2014-11-10 (×7): qty 10

## 2014-11-10 MED ORDER — PROPOFOL 10 MG/ML IV BOLUS
INTRAVENOUS | Status: AC
  Filled 2014-11-10: qty 20

## 2014-11-10 MED ORDER — ONDANSETRON HCL 4 MG/2ML IJ SOLN
INTRAMUSCULAR | Status: AC
Start: 2014-11-10 — End: 2014-11-10
  Filled 2014-11-10: qty 2

## 2014-11-10 MED ORDER — MUPIROCIN 2 % EX OINT
TOPICAL_OINTMENT | CUTANEOUS | Status: AC
  Administered 2014-11-10: 14:00:00
  Filled 2014-11-10: qty 22

## 2014-11-10 MED ORDER — FENTANYL CITRATE (PF) 100 MCG/2ML IJ SOLN
INTRAMUSCULAR | Status: DC | PRN
  Administered 2014-11-10: 50 ug via INTRAVENOUS
  Administered 2014-11-10 (×2): 100 ug via INTRAVENOUS

## 2014-11-10 MED ORDER — PROMETHAZINE HCL 25 MG/ML IJ SOLN: 6.2500 mg | INTRAMUSCULAR | Status: DC | PRN

## 2014-11-10 MED ORDER — DEXTROSE-NACL 5-0.9 % IV SOLN: INTRAVENOUS | Status: DC

## 2014-11-10 MED ORDER — SUCCINYLCHOLINE CHLORIDE 20 MG/ML IJ SOLN
INTRAMUSCULAR | Status: DC | PRN
  Administered 2014-11-10: 60 mg via INTRAVENOUS
  Administered 2014-11-10: 100 mg via INTRAVENOUS
  Administered 2014-11-10: 60 mg via INTRAVENOUS

## 2014-11-10 MED ORDER — FENTANYL CITRATE (PF) 250 MCG/5ML IJ SOLN
INTRAMUSCULAR | Status: AC
  Filled 2014-11-10: qty 25

## 2014-11-10 MED ORDER — LACTATED RINGERS IV SOLN
INTRAVENOUS | Status: DC | PRN
  Administered 2014-11-10 (×2): via INTRAVENOUS

## 2014-11-10 MED ORDER — MIDAZOLAM HCL 5 MG/5ML IJ SOLN
INTRAMUSCULAR | Status: DC | PRN
  Administered 2014-11-10 (×2): 1 mg via INTRAVENOUS

## 2014-11-10 MED ORDER — DEXAMETHASONE SODIUM PHOSPHATE 10 MG/ML IJ SOLN
INTRAMUSCULAR | Status: DC | PRN
  Administered 2014-11-10: 10 mg via INTRAVENOUS

## 2014-11-10 MED ORDER — LIDOCAINE HCL (CARDIAC) 20 MG/ML IV SOLN
INTRAVENOUS | Status: DC | PRN
  Administered 2014-11-10: 100 mg via INTRAVENOUS

## 2014-11-10 MED ORDER — CEFAZOLIN SODIUM-DEXTROSE 2-3 GM-% IV SOLR
INTRAVENOUS | Status: DC | PRN
  Administered 2014-11-10: 2 g via INTRAVENOUS

## 2014-11-10 MED ORDER — SODIUM CHLORIDE 0.9 % IV SOLN
INTRAVENOUS | Status: DC
  Administered 2014-11-10 – 2014-11-12 (×4): via INTRAVENOUS

## 2014-11-10 MED ORDER — PROPOFOL 10 MG/ML IV BOLUS
INTRAVENOUS | Status: DC | PRN
  Administered 2014-11-10: 160 mg via INTRAVENOUS
  Administered 2014-11-10: 50 mg via INTRAVENOUS
  Administered 2014-11-10: 20 mg via INTRAVENOUS

## 2014-11-10 MED ORDER — ONDANSETRON HCL 4 MG/2ML IJ SOLN
INTRAMUSCULAR | Status: DC | PRN
  Administered 2014-11-10: 4 mg via INTRAVENOUS

## 2014-11-10 MED ORDER — LIDOCAINE HCL (CARDIAC) 20 MG/ML IV SOLN
INTRAVENOUS | Status: AC
  Filled 2014-11-10: qty 5

## 2014-11-10 MED ORDER — MIDAZOLAM HCL 2 MG/2ML IJ SOLN
INTRAMUSCULAR | Status: AC
  Filled 2014-11-10: qty 4

## 2014-11-10 MED ORDER — HYDROMORPHONE HCL 1 MG/ML IJ SOLN: 0.2500 mg | INTRAMUSCULAR | Status: DC | PRN

## 2014-11-10 MED ORDER — DEXAMETHASONE SODIUM PHOSPHATE 10 MG/ML IJ SOLN
INTRAMUSCULAR | Status: AC
  Filled 2014-11-10: qty 1

## 2014-11-10 MED ORDER — CEFAZOLIN SODIUM-DEXTROSE 2-3 GM-% IV SOLR
INTRAVENOUS | Status: AC
  Filled 2014-11-10: qty 50

## 2014-11-10 MED ORDER — LABETALOL HCL 5 MG/ML IV SOLN
INTRAVENOUS | Status: DC | PRN
  Administered 2014-11-10: 5 mg via INTRAVENOUS

## 2014-11-10 SURGICAL SUPPLY — 40 items
APPLIER CLIP 5 13 M/L LIGAMAX5 (MISCELLANEOUS) ×3
CABLE HIGH FREQUENCY MONO STRZ (ELECTRODE) ×3 IMPLANT
CATH CHOLANG 76X19 KUMAR (CATHETERS) IMPLANT
CHLORAPREP W/TINT 26ML (MISCELLANEOUS) ×3 IMPLANT
CLIP APPLIE 5 13 M/L LIGAMAX5 (MISCELLANEOUS) ×1 IMPLANT
COVER MAYO STAND STRL (DRAPES) ×3 IMPLANT
COVER SURGICAL LIGHT HANDLE (MISCELLANEOUS) ×3 IMPLANT
CUTTER FLEX LINEAR 45M (STAPLE) IMPLANT
DECANTER SPIKE VIAL GLASS SM (MISCELLANEOUS) ×3 IMPLANT
DRAIN CHANNEL 19F RND (DRAIN) IMPLANT
DRAPE C-ARM 42X120 X-RAY (DRAPES) ×3 IMPLANT
DRAPE LAPAROSCOPIC ABDOMINAL (DRAPES) ×3 IMPLANT
ELECT REM PT RETURN 9FT ADLT (ELECTROSURGICAL) ×3
ELECTRODE REM PT RTRN 9FT ADLT (ELECTROSURGICAL) ×1 IMPLANT
EVACUATOR SILICONE 100CC (DRAIN) IMPLANT
GLOVE BIOGEL PI IND STRL 7.0 (GLOVE) ×1 IMPLANT
GLOVE BIOGEL PI INDICATOR 7.0 (GLOVE) ×2
GLOVE SURG SS PI 7.0 STRL IVOR (GLOVE) ×3 IMPLANT
GOWN STRL REUS W/TWL LRG LVL3 (GOWN DISPOSABLE) ×3 IMPLANT
GOWN STRL REUS W/TWL XL LVL3 (GOWN DISPOSABLE) ×6 IMPLANT
KIT BASIN OR (CUSTOM PROCEDURE TRAY) ×3 IMPLANT
LIQUID BAND (GAUZE/BANDAGES/DRESSINGS) ×3 IMPLANT
PEN SKIN MARKING BROAD (MISCELLANEOUS) ×3 IMPLANT
POUCH RETRIEVAL ECOSAC 10 (ENDOMECHANICALS) ×1 IMPLANT
POUCH RETRIEVAL ECOSAC 10MM (ENDOMECHANICALS) ×2
RELOAD 45 VASCULAR/THIN (ENDOMECHANICALS) IMPLANT
RELOAD STAPLE TA45 3.5 REG BLU (ENDOMECHANICALS) IMPLANT
SCISSORS LAP 5X35 DISP (ENDOMECHANICALS) ×3 IMPLANT
SET IRRIG TUBING LAPAROSCOPIC (IRRIGATION / IRRIGATOR) IMPLANT
SHEARS HARMONIC ACE PLUS 36CM (ENDOMECHANICALS) IMPLANT
SLEEVE XCEL OPT CAN 5 100 (ENDOMECHANICALS) ×6 IMPLANT
STOPCOCK 4 WAY LG BORE MALE ST (IV SETS) IMPLANT
SUT ETHILON 2 0 PS N (SUTURE) IMPLANT
SUT MNCRL AB 4-0 PS2 18 (SUTURE) ×3 IMPLANT
SUT VICRYL 0 ENDOLOOP (SUTURE) IMPLANT
TOWEL OR 17X26 10 PK STRL BLUE (TOWEL DISPOSABLE) ×3 IMPLANT
TOWEL OR NON WOVEN STRL DISP B (DISPOSABLE) IMPLANT
TRAY LAPAROSCOPIC (CUSTOM PROCEDURE TRAY) ×3 IMPLANT
TROCAR BLADELESS OPT 5 100 (ENDOMECHANICALS) ×3 IMPLANT
TROCAR XCEL 12X100 BLDLESS (ENDOMECHANICALS) ×3 IMPLANT

## 2014-11-10 NOTE — Progress Notes (Addendum)
Patient requesting a PPI. Paged NP Elray Mcgregor on call orders received for protonix.

## 2014-11-10 NOTE — Progress Notes (Signed)
Progress Note: General Surgery Service   Subjective: Pain slightly improved.  Objective: Vital signs in last 24 hours: Temp:  [98.2 F (36.8 C)-99.5 F (37.5 C)] 98.3 F (36.8 C) (09/17 0541) Pulse Rate:  [68-99] 87 (09/17 0541) Resp:  [15-18] 18 (09/17 0541) BP: (132-135)/(79-99) 132/84 mmHg (09/17 0541) SpO2:  [99 %-100 %] 99 % (09/17 0541) Last BM Date: 11/06/14  Intake/Output from previous day: 09/16 0701 - 09/17 0700 In: 2415 [I.V.:2415] Out: -  Intake/Output this shift:    Lungs: ctab  Abd: soft, tender to palpation in LUQ, palpable mass in LUQ (balloon)  Extremities: no edema  Neuro: AOx4  Lab Results: CBC   Recent Labs  11/08/14 1914 11/08/14 2115 11/09/14 0600  WBC 5.8  --  5.9  HGB 15.3* 16.3* 12.8  HCT 43.7 48.0* 38.0  PLT 298  --  288   BMET  Recent Labs  11/08/14 2115 11/09/14 0600  NA 140 139  K 4.1 3.9  CL 103 108  CO2  --  24  GLUCOSE 86 92  BUN 9 9  CREATININE 0.90 0.82  CALCIUM  --  8.6*   PT/INR No results for input(s): LABPROT, INR in the last 72 hours. ABG No results for input(s): PHART, HCO3 in the last 72 hours.  Invalid input(s): PCO2, PO2  Studies/Results: Ct Abdomen Pelvis W Contrast  11/09/2014   CLINICAL DATA:  29 year old female. Epigastric pain and nausea x2 weeks. Patient reports he gastric balloon for weight loss.  EXAM: CT ABDOMEN AND PELVIS WITH CONTRAST  TECHNIQUE: Multidetector CT imaging of the abdomen and pelvis was performed using the standard protocol following bolus administration of intravenous contrast.  CONTRAST:  <[phone] deve<[phone] deve<MEASUREMENF621H0W4[id number] deve<MEASUREMENF62H0W4[phone] deve<[id number] deve<[phone] deve<[id number] deve<[id number] deve<[id number] deve<[phone] deve<[id number]32L1403-631-15Games deve<[phone] deve<[phone] deve<[phone] deve<[id number] deve<[id number]32L9226-077-05Games deve<[phone] deve<[id number]32L2303-215-72Games deve<MEASUREMENF621H0W4[phone] deve<[id number]32L219-134-13Games deve<[phone] deve<[id number]32L5541-884-75Games deve<[phone] 07Games deve<[id number]32L3234-116-82Games deve<[phone] deve<[id number]32L3854-075-05Games deve<[phone] deve<[id number]32L2904-233-92Games deve<[phone] deve<[phone] deve<[phone] deve<MEASUREME[facility]<MEASUREMENF621H0W4[facility]32L6405-005-78Games deve<[id number]32L2651-023-53Games deve<[id number] [phone] developer2dnd HerPettiesOL 300 MG/ML  SOLN  COMPARISON:  None.  FINDINGS: The visualized lung bases are clear. No intra-abdominal free air. Trace free fluid within the pelvis.  The liver, gallbladder, pancreas, spleen, adrenal glands appear unremarkable. 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 The right kidney is mildly enlarged with lobulated contour. This may be related  to compensatory hypertrophy. The crossed fused ectopia is less likely. There is mild right hydronephrosis. The visualized right ureter appears unremarkable. 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 The urinary bladder is unremarkable. The uterus is not visualized and may be surgically absent.  A gastric balloon is noted within the distal stomach. Contrast however is noted passing through the stomach into the small bowel without evidence of obstruction. There is marked distention of the gastric balloon with air fluid level. There is distention of the stomach with compression of the adjacent small bowel and mesenteric vasculature. Copious amount of stool noted throughout the colon. The appendix is unremarkable.  The visualized abdominal aorta and IVC appear unremarkable. No portal venous gas identified. There is no lymphadenopathy. An ovoid soft tissue density containing interspersed fat noted extending into the right inguinal canal. There is no definite associated inflammatory changes. Ultrasound may provide better evaluation of this tissue and right inguinal hernia.  Midline vertical anterior pelvic wall incisional scar is noted. There is mild diffuse subcutaneous soft tissue stranding. The osseous structures are grossly unremarkable.  IMPRESSION: Marked distention of the gastric balloon with distention of the stomach and mass-effect on the adjacent small bowel and mesentery. No evidence of bowel obstruction. Constipation. Normal appendix.  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 There is mild fullness of the right renal collecting  system and pelvis.  Right inguinal hernia containing an ovoid soft tissue with interspersed fat. Correlation with clinical exam and ultrasound recommended.   Electronically Signed   By: Elgie Collard M.D.   On: 11/09/2014 00:06     Anti-infectives: Anti-infectives    None      Medicaions: Scheduled Meds: . pantoprazole (PROTONIX) IV  40 mg Intravenous Q24H   Continuous Infusions: . sodium chloride 100 mL/hr at 11/10/14 0807   PRN Meds:.acetaminophen **OR** acetaminophen, morphine injection, ondansetron **OR** ondansetron (ZOFRAN) IV  Assessment/Plan: Patient Active Problem List   Diagnosis Date Noted  . Epigastric abdominal pain 11/09/2014  . Abdominal pain 11/09/2014   I do agree with the patient the balloon should come out -continue NPO -cont PPI -cont IVF -OR today for endoscopic vs laparoscopic removal of balloon  LOS: 1 day   Rodman Pickle, MD Pg# [phone] [place] [facility]

## 2014-11-10 NOTE — Progress Notes (Signed)
PATIENT DETAILS Name: Jessica Sexton Age: 29 y.o. Sex: female Date of Birth: 07/17/1985 Admit Date: 11/08/2014 Admitting Physician Eduard Clos, MD PCP:No primary care provider on file.  Brief Narrative Jessica Sexton is a 29 y.o. female with no significant past medical history who has had previous history of placement of gastric balloon for obesity 2 years ago at [place] presented to the ER because of abdominal pain. Further evaluation with a CT Abd revealed marked distention of the gastric balloon with mass effect on the adjacent structures.  Subjective: She reports feeling better with IV anti-medic therapy. Denies abdominal pain, is currently nothing by mouth. No fevers, chills, hematemesis, bloody stools.  Assessment/Plan: Principal Problem: Epigastric abdominal pain: -Patient undergoing [place] of [place] It appears that this has resulted in mass effect on the adjacent structures. Gen surgery consulted who recommended obtaining a GI consult.  -Patient is interested in the removal of this device. It appears at this device was placed endoscopically, will refer the recommendations from GI.   Active Problems Solitary kidney:  -Lab showing stable creatinine of 0.82 with BUN of 9.  Acute Anemia -Hemoglobin trended down from 16.3 to 12.8 a.m. lab work, she denies blood stools. I wonder about the possibility of erosions.  -Will watch Hg closely, I spoke with GI   Right inguinal hernia :incidental finding-gen surgery to see  Disposition: Remain inpatient  Antimicrobial agents  See below  Anti-infectives    None      DVT Prophylaxis: SCD's  Code Status: Full code  Family Communication None at bedside  Procedures:   CONSULTS:  general surgery  Time spent 20 minutes-Greater than 50% of this time was spent in counseling, explanation of diagnosis, planning of further management, and coordination of  care.  MEDICATIONS: Scheduled Meds: . pantoprazole (PROTONIX) IV  40 mg Intravenous Q24H   Continuous Infusions: . sodium chloride 100 mL/hr at 11/10/14 0807   PRN Meds:.acetaminophen **OR** acetaminophen, morphine injection, ondansetron **OR** ondansetron (ZOFRAN) IV    PHYSICAL EXAM: Vital signs in last 24 hours: Filed Vitals:   11/09/14 1313 11/09/14 2010 11/09/14 2210 11/10/14 0541  BP: 132/79  135/99 132/84  Pulse: 99 68 69 87  Temp: 98.2 F (36.8 C)  99.5 F (37.5 C) 98.3 F (36.8 C)  TempSrc: Oral  Oral Oral  Resp: 15  18 18   Height:      Weight:      SpO2: 100%  100% 99%    Weight change:  Filed Weights   11/08/14 1826 11/09/14 0208  Weight: 66 kg (145 lb 8.1 oz) 71.5 kg (157 lb 10.1 oz)   Body mass index is 27.93 kg/(m^2).   Gen Exam: Awake and alert with clear speech.  Neck: Supple, No JVD.   Chest: B/L Clear.   CVS: S1 S2 Regular, no murmurs.  Abdomen: soft, BS +, mildly tender in epigastrium, non distended.  Extremities: no edema, lower extremities warm to touch. Neurologic: Non Focal.   Skin: No Rash.   Wounds: N/A.    Intake/Output from previous day:  Intake/Output Summary (Last 24 hours) at 11/10/14 0811 Last data filed at 11/10/14 0540  Gross per 24 hour  Intake   2415 ml  Output      0 ml  Net   2415 ml     LAB RESULTS: CBC  Recent Labs Lab 11/08/14 1914 11/08/14 2115 11/09/14  0600  WBC 5.8  --  5.9  HGB 15.3* 16.3* 12.8  HCT 43.7 48.0* 38.0  PLT 298  --  288  MCV 80.8  --  81.4  MCH 28.3  --  27.4  MCHC 35.0  --  33.7  RDW 12.4  --  12.3  LYMPHSABS  --   --  3.4  MONOABS  --   --  0.4  EOSABS  --   --  0.1  BASOSABS  --   --  0.0    Chemistries   Recent Labs Lab 11/08/14 2115 11/09/14 0600  NA 140 139  K 4.1 3.9  CL 103 108  CO2  --  24  GLUCOSE 86 92  BUN 9 9  CREATININE 0.90 0.82  CALCIUM  --  8.6*    CBG:  Recent Labs Lab 11/09/14 0655 11/09/14 1207 11/10/14 0005 11/10/14 0558  GLUCAP 83 84 97  107*    GFR Estimated Creatinine Clearance: 97.6 mL/min (by C-G formula based on Cr of 0.82).  Coagulation profile No results for input(s): INR, PROTIME in the last 168 hours.  Cardiac Enzymes No results for input(s): CKMB, TROPONINI, MYOGLOBIN in the last 168 hours.  Invalid input(s): CK  Invalid input(s): POCBNP No results for input(s): DDIMER in the last 72 hours. No results for input(s): HGBA1C in the last 72 hours. No results for input(s): CHOL, HDL, LDLCALC, TRIG, CHOLHDL, LDLDIRECT in the last 72 hours. No results for input(s): TSH, T4TOTAL, T3FREE, THYROIDAB in the last 72 hours.  Invalid input(s): FREET3 No results for input(s): VITAMINB12, FOLATE, FERRITIN, TIBC, IRON, RETICCTPCT in the last 72 hours.  Recent Labs  11/08/14 2107  LIPASE 16*    Urine Studies No results for input(s): UHGB, CRYS in the last 72 hours.  Invalid input(s): UACOL, UAPR, USPG, UPH, UTP, UGL, UKET, UBIL, UNIT, UROB, ULEU, UEPI, UWBC, URBC, UBAC, CAST, UCOM, BILUA  MICROBIOLOGY: No results found for this or any previous visit (from the past 240 hour(s)).  RADIOLOGY STUDIES/RESULTS: Ct Abdomen Pelvis W Contrast  11/09/2014   CLINICAL DATA:  29 year old female. Epigastric pain and nausea x2 weeks. Patient reports he gastric balloon for weight loss.  EXAM: CT ABDOMEN AND PELVIS WITH CONTRAST  TECHNIQUE: Multidetector CT imaging of the abdomen and pelvis was performed using the standard protocol following bolus administration of intravenous contrast.  CONTRAST:  OMNIPAQUE IOHEXOL 300 MG/ML  SOLN  COMPARISON:  None.  FINDINGS: The visualized lung bases are clear. No intra-abdominal free air. Trace free fluid within the pelvis.  The liver, gallbladder, pancreas, spleen, adrenal glands appear unremarkable. 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 The right kidney is mildly enlarged with lobulated contour. This may be related to compensatory hypertrophy. The crossed fused  ectopia is less likely. There is mild right hydronephrosis. The visualized right ureter appears unremarkable. 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 The urinary bladder is unremarkable. The uterus is not visualized and may be surgically absent.  A gastric balloon is noted within the distal stomach. Contrast however is noted passing through the stomach into the small bowel without evidence of obstruction. There is marked distention of the gastric balloon with air fluid level. There is distention of the stomach with compression of the adjacent small bowel and mesenteric vasculature. Copious amount of stool noted throughout the colon. The appendix is unremarkable.  The visualized abdominal aorta and IVC appear  unremarkable. No portal venous gas identified. There is no lymphadenopathy. An ovoid soft tissue density containing interspersed fat noted extending into the right inguinal canal. There is no definite associated inflammatory changes. Ultrasound may provide better evaluation of this tissue and right inguinal hernia.  Midline vertical anterior pelvic wall incisional scar is noted. There is mild diffuse subcutaneous soft tissue stranding. The osseous structures are grossly unremarkable.  IMPRESSION: Marked distention of the gastric balloon with distention of the stomach and mass-effect on the adjacent small bowel and mesentery. No evidence of bowel obstruction. Constipation. Normal appendix.  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 There is mild fullness of the right renal collecting system and pelvis.  Right inguinal hernia containing an ovoid soft tissue with interspersed fat. Correlation with clinical exam and ultrasound recommended.   Electronically Signed   By: Elgie Collard M.D.   On: 11/09/2014 00:06    Jeralyn Bennett, MD  [facility] Pager:[phone]  If 7PM-7AM,  please contact night-coverage [email or web address] Password [email or web address] 11/10/2014, 8:11 AM   LOS: 1 day

## 2014-11-10 NOTE — Op Note (Signed)
Preoperative diagnosis: malfunction intragastric balloon  Postoperative diagnosis: Same   Procedure: endoscopic retrieval of intragastric balloon  Surgeon: Feliciana Rossetti, M.D.  Asst: Ovidio Kin, M.D.  Anesthesia: Gen.   Indications for procedure: Jessica Sexton is a 29 y.o. female with symptoms of abdominal pain for the past 4 weeks worsening over 4 days. She had an intragastric balloon placed for weight loss 2 years ago and has since lost 100lbs. The balloon is approved by the company to be present for 1 year. After discussing with the patient the risks and benefits. She agreed to proceed with endoscopic vs lap retrieval of gastric balloon.  Description of procedure: The patient was brought into the operative suite, placed supine. Anesthesia was administered with endotracheal tube. Next the endoscope was entered in the esophagus under direct vision and advanced down to the stomach. Large amount of fluid was suctioned out and the intragastric balloon was visible first we took a 23-gauge needle and stuck the balloon in multiple places to allow drainage this allowed a very small amount of drainage. Next we took a endo-scissors and were able to cut a small portion around the valve part of the balloon is a lot of fair amount of drainage. We tried taking this area and opened it further with a dissecting forcep this was unsuccessful. Next we poked the body of the balloon multiple times with a 23-gauge needle to increase drainage again. Once this was done we able to snare the tubing of the balloon and removed the tubing in piecemeal and we had to do this 2 times we also worked with anesthesia to ensure that she was clearly paralyzed so that there would be minimal restriction within the esophagus and minimal damage to esophagus. However, there was still too much resistance of the balloon to get it into the esophagus. Next I attempted to use scissors on the body portion of the stomach. This proved ineffective  for drainage and we went back to the valve portion of the balloon were able to cut in 80% of the circumference of the valve away using the endo-scissors.Once this was done we were able to snare the remaining portion of the balloon and it came out in one piece. We readvanced the scope back into the stomach there were multiple ulcerations along the body and antrum. There was one small piece of tubing left which was removed with a Talon forceps. At this time the procedure was terminated successfully patient awoke from anesthesia and was brought to PACU in stable condition.  The total endoscopic operative time was 130 minutes.  Findings: intragastric balloon -multiple ulcerations throughout the body and antrum  Specimen: intragastric balloon  Blood loss: <50cc  Complications: none  Feliciana Rossetti, M.D. General, Bariatric, & Minimally Invasive Surgery [facility]

## 2014-11-10 NOTE — Anesthesia Preprocedure Evaluation (Signed)
Anesthesia Evaluation  Patient identified by MRN, date of birth, ID band Patient awake    Reviewed: Allergy & Precautions, NPO status , Patient's Chart, lab work & pertinent test results  Airway Mallampati: II  TM Distance: >3 FB Neck ROM: Full    Dental   Pulmonary neg pulmonary ROS,    breath sounds clear to auscultation       Cardiovascular negative cardio ROS   Rhythm:Regular Rate:Normal     Neuro/Psych    GI/Hepatic negative GI ROS, Neg liver ROS,   Endo/Other  negative endocrine ROS  Renal/GU negative Renal ROS     Musculoskeletal   Abdominal   Peds  Hematology negative hematology ROS (+)   Anesthesia Other Findings   Reproductive/Obstetrics                             Anesthesia Physical Anesthesia Plan  ASA: III and emergent  Anesthesia Plan: General   Post-op Pain Management:    Induction: Intravenous  Airway Management Planned: Oral ETT  Additional Equipment:   Intra-op Plan:   Post-operative Plan: Possible Post-op intubation/ventilation  Informed Consent: I have reviewed the patients History and Physical, chart, labs and discussed the procedure including the risks, benefits and alternatives for the proposed anesthesia with the patient or authorized representative who has indicated his/her understanding and acceptance.   Dental advisory given  Plan Discussed with: CRNA and Anesthesiologist  Anesthesia Plan Comments:         Anesthesia Quick Evaluation

## 2014-11-10 NOTE — Transfer of Care (Signed)
Immediate Anesthesia Transfer of Care Note  Patient: Jessica Sexton  Procedure(s) Performed: Procedure(s): UPPER GI ENDOSCOPY (N/A) LAPAROSCOPY DIAGNOSTIC (N/A)  Patient Location: PACU  Anesthesia Type:General  Level of Consciousness: awake, alert , oriented and patient cooperative  Airway & Oxygen Therapy: Patient Spontanous Breathing and Patient connected to face mask oxygen  Post-op Assessment: Report given to RN, Post -op Vital signs reviewed and stable and Patient moving all extremities X 4  Post vital signs: stable  Last Vitals:  Filed Vitals:   11/10/14 1715  BP: 136/84  Pulse: 97  Temp: 37.1 C  Resp: 22    Complications: No apparent anesthesia complications

## 2014-11-10 NOTE — Consult Note (Signed)
[facility] Consult Note  Referring Provider: No ref. provider found Primary Care Physician:  No primary care provider on file. Primary Gastroenterologist:  Dr.  Laurel Dimmer Complaint: Nausea and vomiting HPI: Jessica Sexton is an 29 y.o. delightful [place] female  status post gastric balloon placement for obesity 2 years ago with subsequent loss of over 100 pounds. She presents of nausea and vomiting and bili to hold food down for about 3 or 4 days. On CT scan balloon appears to occupy the vast majority of her stomach although some contrast did pass. We are consulted to consider deflating her removing the balloon. As above.  History reviewed. No pertinent past medical history.  Past Surgical History  Procedure Laterality Date  . Gastric balloon    . Esophagogastroduodenoscopy endoscopy      Medications Prior to Admission  Medication Sig Dispense Refill  . metoCLOPramide (REGLAN) 10 MG tablet Take 10 mg by mouth every 6 (six) hours as needed for nausea or vomiting.    . pantoprazole (PROTONIX) 20 MG tablet Take 20 mg by mouth daily.      Allergies: No Known Allergies  Family History  Problem Relation Age of Onset  . Diabetes Mellitus II Neg Hx   . Hypertension Neg Hx     Social History:  reports that she has never smoked. She has never used smokeless tobacco. She reports that she does not drink alcohol or use illicit drugs.  Review of Systems: negative except as above   Blood pressure 132/84, pulse 87, temperature 98.3 F (36.8 C), temperature source Oral, resp. rate 18, height _0  (1.6 m), weight 71.5 kg (157 lb 10.1 oz), last menstrual period 11/04/2014, SpO2 99 %. Head: Normocephalic, without obvious abnormality, atraumatic Neck: no adenopathy, no carotid bruit, no JVD, supple, symmetrical, trachea midline and thyroid not enlarged, symmetric, no tenderness/mass/nodules Resp: clear to auscultation bilaterally Cardio: regular rate and rhythm, S1, S2 normal, no murmur,  click, rub or gallop GI: Abdomen soft slightly distended with normoactive bowel sounds. No hepatomegaly masses or guarding Extremities: extremities normal, atraumatic, no cyanosis or edema  Results for orders placed or performed during the hospital encounter of 11/08/14 (from the past 48 hour(s))  CBC     Status: Abnormal   Collection Time: 11/08/14  7:14 PM  Result Value Ref Range   WBC 5.8 4.0 - 10.5 K/uL    Comment: WHITE COUNT CONFIRMED ON SMEAR   RBC 5.41 (H) 3.87 - 5.11 MIL/uL   Hemoglobin 15.3 (H) 12.0 - 15.0 g/dL   HCT 43.7 36.0 - 46.0 %   MCV 80.8 78.0 - 100.0 fL   MCH 28.3 26.0 - 34.0 pg   MCHC 35.0 30.0 - 36.0 g/dL   RDW 12.4 11.5 - 15.5 %   Platelets 298 150 - 400 K/uL    Comment: REPEATED TO VERIFY SPECIMEN CHECKED FOR CLOTS PLATELET COUNT CONFIRMED BY SMEAR   I-Stat beta hCG blood, ED ([facility], [facility], [facility] only)     Status: None   Collection Time: 11/08/14  7:20 PM  Result Value Ref Range   I-stat hCG, quantitative <5.0 <5 mIU/mL   Comment 3            Comment:   GEST. AGE      CONC.  (mIU/mL)   <=1 WEEK        5 - 50     2 WEEKS       50 - 500     3 WEEKS  100 - 10,000     4 WEEKS     1,000 - 30,000        FEMALE AND NON-PREGNANT FEMALE:     LESS THAN 5 mIU/mL   Urinalysis, Routine w reflex microscopic (not at [facility])     Status: Abnormal   Collection Time: 11/08/14  8:31 PM  Result Value Ref Range   Color, Urine AMBER (A) YELLOW    Comment: BIOCHEMICALS MAY BE AFFECTED BY COLOR   APPearance CLEAR CLEAR   Specific Gravity, Urine 1.022 1.005 - 1.030   pH 5.5 5.0 - 8.0   Glucose, UA NEGATIVE NEGATIVE mg/dL   Hgb urine dipstick NEGATIVE NEGATIVE   Bilirubin Urine SMALL (A) NEGATIVE   Ketones, ur 40 (A) NEGATIVE mg/dL   Protein, ur NEGATIVE NEGATIVE mg/dL   Urobilinogen, UA 1.0 0.0 - 1.0 mg/dL   Nitrite NEGATIVE NEGATIVE   Leukocytes, UA NEGATIVE NEGATIVE    Comment: MICROSCOPIC NOT DONE ON URINES WITH NEGATIVE PROTEIN, BLOOD, LEUKOCYTES, NITRITE, OR GLUCOSE  <1000 mg/dL.  Hepatic function panel     Status: Abnormal   Collection Time: 11/08/14  9:07 PM  Result Value Ref Range   Total Protein 7.6 6.5 - 8.1 g/dL   Albumin 4.0 3.5 - 5.0 g/dL   AST 16 15 - 41 U/L   ALT 12 (L) 14 - 54 U/L   Alkaline Phosphatase 105 38 - 126 U/L   Total Bilirubin 0.7 0.3 - 1.2 mg/dL   Bilirubin, Direct 0.1 0.1 - 0.5 mg/dL   Indirect Bilirubin 0.6 0.3 - 0.9 mg/dL  Lipase, blood     Status: Abnormal   Collection Time: 11/08/14  9:07 PM  Result Value Ref Range   Lipase 16 (L) 22 - 51 U/L  I-stat chem 8, ed     Status: Abnormal   Collection Time: 11/08/14  9:15 PM  Result Value Ref Range   Sodium 140 135 - 145 mmol/L   Potassium 4.1 3.5 - 5.1 mmol/L   Chloride 103 101 - 111 mmol/L   BUN 9 6 - 20 mg/dL   Creatinine, Ser 0.90 0.44 - 1.00 mg/dL   Glucose, Bld 86 65 - 99 mg/dL   Calcium, Ion 1.14 1.12 - 1.23 mmol/L   TCO2 23 0 - 100 mmol/L   Hemoglobin 16.3 (H) 12.0 - 15.0 g/dL   HCT 48.0 (H) 36.0 - 46.0 %  Comprehensive metabolic panel     Status: Abnormal   Collection Time: 11/09/14  6:00 AM  Result Value Ref Range   Sodium 139 135 - 145 mmol/L   Potassium 3.9 3.5 - 5.1 mmol/L   Chloride 108 101 - 111 mmol/L   CO2 24 22 - 32 mmol/L   Glucose, Bld 92 65 - 99 mg/dL   BUN 9 6 - 20 mg/dL   Creatinine, Ser 0.82 0.44 - 1.00 mg/dL   Calcium 8.6 (L) 8.9 - 10.3 mg/dL   Total Protein 5.9 (L) 6.5 - 8.1 g/dL   Albumin 3.1 (L) 3.5 - 5.0 g/dL   AST 10 (L) 15 - 41 U/L   ALT 9 (L) 14 - 54 U/L   Alkaline Phosphatase 83 38 - 126 U/L   Total Bilirubin 0.8 0.3 - 1.2 mg/dL   GFR calc non Af Amer >60 >60 mL/min   GFR calc Af Amer >60 >60 mL/min    Comment: (NOTE) The eGFR has been calculated using the CKD EPI equation. This calculation has not been validated in all clinical situations.  eGFR's persistently <60 mL/min signify possible Chronic Kidney Disease.    Anion gap 7 5 - 15  CBC WITH DIFFERENTIAL     Status: None   Collection Time: 11/09/14  6:00 AM  Result  Value Ref Range   WBC 5.9 4.0 - 10.5 K/uL   RBC 4.67 3.87 - 5.11 MIL/uL   Hemoglobin 12.8 12.0 - 15.0 g/dL    Comment: DELTA CHECK NOTED REPEATED TO VERIFY PREVIOUS RESULT ISTAT    HCT 38.0 36.0 - 46.0 %   MCV 81.4 78.0 - 100.0 fL   MCH 27.4 26.0 - 34.0 pg   MCHC 33.7 30.0 - 36.0 g/dL   RDW 12.3 11.5 - 15.5 %   Platelets 288 150 - 400 K/uL   Neutrophils Relative % 34 %   Neutro Abs 2.0 1.7 - 7.7 K/uL   Lymphocytes Relative 57 %   Lymphs Abs 3.4 0.7 - 4.0 K/uL   Monocytes Relative 6 %   Monocytes Absolute 0.4 0.1 - 1.0 K/uL   Eosinophils Relative 2 %   Eosinophils Absolute 0.1 0.0 - 0.7 K/uL   Basophils Relative 0 %   Basophils Absolute 0.0 0.0 - 0.1 K/uL  Glucose, capillary     Status: None   Collection Time: 11/09/14  6:55 AM  Result Value Ref Range   Glucose-Capillary 83 65 - 99 mg/dL  Glucose, capillary     Status: None   Collection Time: 11/09/14 12:07 PM  Result Value Ref Range   Glucose-Capillary 84 65 - 99 mg/dL   Comment 1 Notify RN   Glucose, capillary     Status: None   Collection Time: 11/10/14 12:05 AM  Result Value Ref Range   Glucose-Capillary 97 65 - 99 mg/dL   Comment 1 Notify RN    Comment 2 Document in Chart   Glucose, capillary     Status: Abnormal   Collection Time: 11/10/14  5:58 AM  Result Value Ref Range   Glucose-Capillary 107 (H) 65 - 99 mg/dL  Glucose, capillary     Status: None   Collection Time: 11/10/14 11:39 AM  Result Value Ref Range   Glucose-Capillary 90 65 - 99 mg/dL   Ct Abdomen Pelvis W Contrast  11/09/2014   CLINICAL DATA:  29 year old female. Epigastric pain and nausea x2 weeks. Patient reports he gastric balloon for weight loss.  EXAM: CT ABDOMEN AND PELVIS WITH CONTRAST  TECHNIQUE: Multidetector CT imaging of the abdomen and pelvis was performed using the standard protocol following bolus administration of intravenous contrast.  CONTRAST:  171m OMNIPAQUE IOHEXOL 300 MG/ML  SOLN  COMPARISON:  None.  FINDINGS: The visualized lung  bases are clear. No intra-abdominal free air. Trace free fluid within the pelvis.  The liver, gallbladder, pancreas, spleen, adrenal glands appear unremarkable. 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 The right kidney is mildly enlarged with lobulated contour. This may be related to compensatory hypertrophy. The crossed fused ectopia is less likely. There is mild right hydronephrosis. The visualized right ureter appears unremarkable. 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 The urinary bladder is unremarkable. The uterus is not visualized and may be surgically absent.  A gastric balloon is noted within the distal stomach. Contrast however is noted passing through the stomach into the small bowel without evidence of obstruction. There is marked distention of the gastric balloon with air fluid level. There is distention of the stomach with  compression of the adjacent small bowel and mesenteric vasculature. Copious amount of stool noted throughout the colon. The appendix is unremarkable.  The visualized abdominal aorta and IVC appear unremarkable. No portal venous gas identified. There is no lymphadenopathy. An ovoid soft tissue density containing interspersed fat noted extending into the right inguinal canal. There is no definite associated inflammatory changes. Ultrasound may provide better evaluation of this tissue and right inguinal hernia.  Midline vertical anterior pelvic wall incisional scar is noted. There is mild diffuse subcutaneous soft tissue stranding. The osseous structures are grossly unremarkable.  IMPRESSION: Marked distention of the gastric balloon with distention of the stomach and mass-effect on the adjacent small bowel and mesentery. No evidence of bowel obstruction. Constipation. Normal appendix.  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 There is mild  fullness of the right renal collecting system and pelvis.  Right inguinal hernia containing an ovoid soft tissue with interspersed fat. Correlation with clinical exam and ultrasound recommended.   Electronically Signed   By: Anner Crete M.D.   On: 11/09/2014 00:06    Assessment: Nausea and vomiting secondary to functional obstruction from gastric balloon. Plan:  We'll familiarized myself with this device which there is significant available instruction online and determine whether this can be safely partially deflated or removed endoscopically without special expertise or experience. We'll follow with you. I have the phone number of her equally delightful sister, who is a physician and will text meet the link to [place] C 11/10/2014, 12:09 PM  Pager [phone] If no answer or after 5 PM call [phone]

## 2014-11-10 NOTE — Anesthesia Postprocedure Evaluation (Signed)
  Anesthesia Post-op Note  Patient: Jessica Sexton  Procedure(s) Performed: Procedure(s): UPPER GI ENDOSCOPY (N/A) LAPAROSCOPY DIAGNOSTIC (N/A)  Patient Location: PACU  Anesthesia Type:General  Level of Consciousness: awake  Airway and Oxygen Therapy: Patient Spontanous Breathing  Post-op Pain: mild  Post-op Assessment: Post-op Vital signs reviewed              Post-op Vital Signs: Reviewed  Last Vitals:  Filed Vitals:   11/10/14 1800  BP: 133/61  Pulse: 77  Temp: 37.1 C  Resp: 12    Complications: No apparent anesthesia complications

## 2014-11-10 NOTE — Anesthesia Procedure Notes (Addendum)
Performed by: Anastasio Champion E   Procedure Name: Intubation Date/Time: 11/10/2014 2:54 PM Performed by: Illene Silver Pre-anesthesia Checklist: Patient identified, Emergency Drugs available, Suction available and Patient being monitored Patient Re-evaluated:Patient Re-evaluated prior to inductionOxygen Delivery Method: Circle System Utilized Preoxygenation: Pre-oxygenation with 100% oxygen Intubation Type: IV induction Ventilation: Mask ventilation without difficulty Laryngoscope Size: Mac and 3 Grade View: Grade I Tube type: Oral Tube size: 7.5 mm Number of attempts: 1 Airway Equipment and Method: Stylet and Oral airway Placement Confirmation: ETT inserted through vocal cords under direct vision,  positive ETCO2 and breath sounds checked- equal and bilateral Secured at: 21 cm Tube secured with: Tape Dental Injury: Teeth and Oropharynx as per pre-operative assessment

## 2014-11-10 NOTE — Progress Notes (Signed)
Patient and family requesting to see a doctor. Wanting to know about surgery and how long it will be before it happens. Paged MD on call for [facility]. Dr Abbey Chatters told to tell patient he does not know but a bariatric surgeon will be coming by in am between 8-9 am and to have her family here to get questions answered.

## 2014-11-11 LAB — BASIC METABOLIC PANEL
ANION GAP: 8 (ref 5–15)
BUN: 6 mg/dL (ref 6–20)
CHLORIDE: 109 mmol/L (ref 101–111)
CO2: 23 mmol/L (ref 22–32)
Calcium: 8.5 mg/dL — ABNORMAL LOW (ref 8.9–10.3)
Creatinine, Ser: 0.72 mg/dL (ref 0.44–1.00)
Glucose, Bld: 104 mg/dL — ABNORMAL HIGH (ref 65–99)
POTASSIUM: 4.3 mmol/L (ref 3.5–5.1)
SODIUM: 140 mmol/L (ref 135–145)

## 2014-11-11 LAB — CBC
HEMATOCRIT: 38.2 % (ref 36.0–46.0)
HEMOGLOBIN: 13.2 g/dL (ref 12.0–15.0)
MCH: 28 pg (ref 26.0–34.0)
MCHC: 34.6 g/dL (ref 30.0–36.0)
MCV: 80.9 fL (ref 78.0–100.0)
Platelets: 330 10*3/uL (ref 150–400)
RBC: 4.72 MIL/uL (ref 3.87–5.11)
RDW: 11.9 % (ref 11.5–15.5)
WBC: 5.1 10*3/uL (ref 4.0–10.5)

## 2014-11-11 LAB — GLUCOSE, CAPILLARY
GLUCOSE-CAPILLARY: 71 mg/dL (ref 65–99)
GLUCOSE-CAPILLARY: 89 mg/dL (ref 65–99)
GLUCOSE-CAPILLARY: 92 mg/dL (ref 65–99)
Glucose-Capillary: 84 mg/dL (ref 65–99)

## 2014-11-11 MED ORDER — PANTOPRAZOLE SODIUM 40 MG PO TBEC
40.0000 mg | DELAYED_RELEASE_TABLET | Freq: Two times a day (BID) | ORAL | Status: DC
  Administered 2014-11-11 – 2014-11-12 (×3): 40 mg via ORAL
  Filled 2014-11-11 (×3): qty 1

## 2014-11-11 NOTE — Progress Notes (Signed)
PATIENT DETAILS Name: Jessica Sexton Age: 29 y.o. Sex: female Date of Birth: 10/08/1985 Admit Date: 11/08/2014 Admitting Physician Eduard Clos, MD PCP:No primary care provider on file.  Brief Narrative Jessica Sexton is a 29 y.o. female with no significant past medical history who has had previous history of placement of gastric balloon for obesity 2 years ago at [place] presented to the ER because of abdominal pain. Further evaluation with a CT Abd revealed marked distention of the gastric balloon with mass effect on the adjacent structures.  Subjective: She reports feeling better with IV anti-medic therapy. Denies abdominal pain, is currently nothing by mouth. No fevers, chills, hematemesis, bloody stools.  Assessment/Plan: Principal Problem: Epigastric abdominal pain: -[place] It appears that this has resulted in mass effect on the adjacent structures. Gen surgery consulted who recommended obtaining a GI consult.  -On 11/10/2014 she underwent endoscopic removal of gastric balloon procedure was performed by Dr Sheliah Hatch of Bariatric Surgery. Endoscopic operative time was 130 minutes. She tolerated procedure well there no immediate complications. -Case discussed with surgery today, plan to advance her diet.  Active Problems Solitary kidney:  -Lab showing stable creatinine of 0.82 with BUN of 9.  Acute Anemia -Hemoglobin remained stable at 13.2  Right inguinal hernia :incidental finding-gen surgery to see  Disposition: Her diet is being advanced today, will monitor over the next 24 hours for postoperative complications. Anticipate discharge in a.m.  Antimicrobial agents  See below  Anti-infectives    None      DVT Prophylaxis: SCD's  Code Status: Full code  Family Communication None at bedside  Procedures:   CONSULTS:  general surgery  Time spent 20 minutes-Greater  than 50% of this time was spent in counseling, explanation of diagnosis, planning of further management, and coordination of care.  MEDICATIONS: Scheduled Meds: . pantoprazole  40 mg Oral BID  . sucralfate  1 g Oral TID WC & HS   Continuous Infusions: . sodium chloride 75 mL/hr at 11/11/14 0850   PRN Meds:.acetaminophen **OR** acetaminophen, morphine injection, ondansetron **OR** ondansetron (ZOFRAN) IV    PHYSICAL EXAM: Vital signs in last 24 hours: Filed Vitals:   11/10/14 1800 11/10/14 1816 11/10/14 2044 11/11/14 0557  BP: 133/61 135/77 117/80 148/91  Pulse: 77 85 86 61  Temp: 98.8 F (37.1 C) 98.6 F (37 C) 98.9 F (37.2 C) 98.2 F (36.8 C)  TempSrc:  Oral Oral Oral  Resp: Height:      Weight:      SpO2: 91% 96% 100% 100%    Weight change:  Filed Weights   11/08/14 1826 11/09/14 0208  Weight: 66 kg (145 lb 8.1 oz) 71.5 kg (157 lb 10.1 oz)   Body mass index is 27.93 kg/(m^2).   Gen Exam: Awake and alert with clear speech, she is against [place] morning, no acute distress.  Neck: Supple, No JVD.   Chest: B/L Clear.   CVS: S1 S2 Regular, no murmurs.  Abdomen: Soft, nontender nondistended Extremities: no edema, lower extremities warm to touch. Neurologic: Non Focal.   Skin: No Rash.   Wounds: N/A.    Intake/Output from previous day:  Intake/Output Summary (Last 24 hours) at 11/11/14 1057 Last data filed at 11/11/14 0609  Gross per 24 hour  Intake 3795.27 ml  Output      0 ml  Net 3795.27 ml     LAB RESULTS: CBC  Recent Labs Lab 11/08/14 1914 11/08/14 2115 11/09/14 0600 11/11/14 0424  WBC 5.8  --  5.9 5.1  HGB 15.3* 16.3* 12.8 13.2  HCT 43.7 48.0* 38.0 38.2  PLT 298  --  288 330  MCV 80.8  --  81.4 80.9  MCH 28.3  --  27.4 28.0  MCHC 35.0  --  33.7 34.6  RDW 12.4  --  12.3 11.9  LYMPHSABS  --   --  3.4  --   MONOABS  --   --  0.4  --   EOSABS  --   --  0.1  --   BASOSABS  --   --  0.0  --     Chemistries   Recent  Labs Lab 11/08/14 2115 11/09/14 0600 11/11/14 0424  NA 140 139 140  K 4.1 3.9 4.3  CL 103 108 109  CO2  --  24 23  GLUCOSE 86 92 104*  BUN CREATININE 0.90 0.82 0.72  CALCIUM  --  8.6* 8.5*    CBG:  Recent Labs Lab 11/10/14 0005 11/10/14 0558 11/10/14 1139 11/10/14 1818 11/11/14 0553  GLUCAP 97 107* 90 106* 84    GFR Estimated Creatinine Clearance: 100.1 mL/min (by C-G formula based on Cr of 0.72).  Coagulation profile No results for input(s): INR, PROTIME in the last 168 hours.  Cardiac Enzymes No results for input(s): CKMB, TROPONINI, MYOGLOBIN in the last 168 hours.  Invalid input(s): CK  Invalid input(s): POCBNP No results for input(s): DDIMER in the last 72 hours. No results for input(s): HGBA1C in the last 72 hours. No results for input(s): CHOL, HDL, LDLCALC, TRIG, CHOLHDL, LDLDIRECT in the last 72 hours. No results for input(s): TSH, T4TOTAL, T3FREE, THYROIDAB in the last 72 hours.  Invalid input(s): FREET3 No results for input(s): VITAMINB12, FOLATE, FERRITIN, TIBC, IRON, RETICCTPCT in the last 72 hours.  Recent Labs  11/08/14 2107  LIPASE 16*    Urine Studies No results for input(s): UHGB, CRYS in the last 72 hours.  Invalid input(s): UACOL, UAPR, USPG, UPH, UTP, UGL, UKET, UBIL, UNIT, UROB, ULEU, UEPI, UWBC, URBC, UBAC, CAST, UCOM, BILUA  MICROBIOLOGY: Recent Results (from the past 240 hour(s))  Surgical PCR screen     Status: None   Collection Time: 11/10/14  1:56 PM  Result Value Ref Range Status   MRSA, PCR NEGATIVE NEGATIVE Final   Staphylococcus aureus NEGATIVE NEGATIVE Final    Comment:        The Xpert SA Assay (FDA approved for NASAL specimens in patients over 30 years of age), is one component of a comprehensive surveillance program.  Test performance has been validated by [facility] for patients greater than or equal to 12 year old. It is not intended to diagnose infection nor to guide or monitor treatment.      RADIOLOGY STUDIES/RESULTS: Ct Abdomen Pelvis W Contrast  11/09/2014   CLINICAL DATA:  29 year old female. Epigastric pain and nausea x2 weeks. Patient reports he gastric balloon for weight loss.  EXAM: CT ABDOMEN AND PELVIS WITH CONTRAST  TECHNIQUE: Multidetector CT imaging of the abdomen and pelvis was performed using the standard protocol following bolus administration of intravenous contrast.  CONTRAST:  OMNIPAQUE IOHEXOL 300 MG/ML  SOLN  COMPARISON:  None.  FINDINGS: The visualized lung bases are clear. No intra-abdominal free air. Trace free fluid within the pelvis.  The liver, gallbladder, pancreas, spleen, adrenal  glands appear unremarkable. 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 The right kidney is mildly enlarged with lobulated contour. This may be related to compensatory hypertrophy. The crossed fused ectopia is less likely. There is mild right hydronephrosis. The visualized right ureter appears unremarkable. 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 The urinary bladder is unremarkable. The uterus is not visualized and may be surgically absent.  A gastric balloon is noted within the distal stomach. Contrast however is noted passing through the stomach into the small bowel without evidence of obstruction. There is marked distention of the gastric balloon with air fluid level. There is distention of the stomach with compression of the adjacent small bowel and mesenteric vasculature. Copious amount of stool noted throughout the colon. The appendix is unremarkable.  The visualized abdominal aorta and IVC appear unremarkable. No portal venous gas identified. There is no lymphadenopathy. An ovoid soft tissue density containing interspersed fat noted extending into the right inguinal canal. There is no definite associated inflammatory changes. Ultrasound may provide better evaluation of this tissue  and right inguinal hernia.  Midline vertical anterior pelvic wall incisional scar is noted. There is mild diffuse subcutaneous soft tissue stranding. The osseous structures are grossly unremarkable.  IMPRESSION: Marked distention of the gastric balloon with distention of the stomach and mass-effect on the adjacent small bowel and mesentery. No evidence of bowel obstruction. Constipation. Normal appendix.  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 There is mild fullness of the right renal collecting system and pelvis.  Right inguinal hernia containing an ovoid soft tissue with interspersed fat. Correlation with clinical exam and ultrasound recommended.   Electronically Signed   By: Elgie Collard M.D.   On: 11/09/2014 00:06    Jeralyn Bennett, MD  [facility] Pager:[phone]  If 7PM-7AM, please contact night-coverage [email or web address] Password [email or web address] 11/11/2014, 10:57 AM   LOS: 2 days

## 2014-11-11 NOTE — Progress Notes (Signed)
1 Day Post-Op  Subjective: Denies abdominal pain or nausea.  Objective: Vital signs in last 24 hours: Temp:  [98.2 F (36.8 C)-98.9 F (37.2 C)] 98.2 F (36.8 C) (09/18 0557) Pulse Rate:  [61-103] 61 (09/18 0557) Resp:  [12-24] 16 (09/18 0557) BP: (117-148)/(61-91) 148/91 mmHg (09/18 0557) SpO2:  [91 %-100 %] 100 % (09/18 0557) Last BM Date: 11/06/14  Intake/Output from previous day: 09/17 0701 - 09/18 0700 In: 3795.3 [I.V.:3795.3] Out: -  Intake/Output this shift:    PE: General- In NAD Abdomen-soft, not tender  Lab Results:   Recent Labs  11/09/14 0600 11/11/14 0424  WBC 5.9 5.1  HGB 12.8 13.2  HCT 38.0 38.2  PLT 288 330   BMET  Recent Labs  11/09/14 0600 11/11/14 0424  NA 139 140  K 3.9 4.3  CL 108 109  CO2 24 23  GLUCOSE 92 104*  BUN 9 6  CREATININE 0.82 0.72  CALCIUM 8.6* 8.5*   PT/INR No results for input(s): LABPROT, INR in the last 72 hours. Comprehensive Metabolic Panel:    Component Value Date/Time   NA 140 11/11/2014 0424   NA 139 11/09/2014 0600   K 4.3 11/11/2014 0424   K 3.9 11/09/2014 0600   CL 109 11/11/2014 0424   CL 108 11/09/2014 0600   CO2 23 11/11/2014 0424   CO2 24 11/09/2014 0600   BUN 6 11/11/2014 0424   BUN 9 11/09/2014 0600   CREATININE 0.72 11/11/2014 0424   CREATININE 0.82 11/09/2014 0600   GLUCOSE 104* 11/11/2014 0424   GLUCOSE 92 11/09/2014 0600   CALCIUM 8.5* 11/11/2014 0424   CALCIUM 8.6* 11/09/2014 0600   AST 10* 11/09/2014 0600   AST 16 11/08/2014 2107   ALT 9* 11/09/2014 0600   ALT 12* 11/08/2014 2107   ALKPHOS 83 11/09/2014 0600   ALKPHOS 105 11/08/2014 2107   BILITOT 0.8 11/09/2014 0600   BILITOT 0.7 11/08/2014 2107   PROT 5.9* 11/09/2014 0600   PROT 7.6 11/08/2014 2107   ALBUMIN 3.1* 11/09/2014 0600   ALBUMIN 4.0 11/08/2014 2107     Studies/Results: No results found.  Anti-infectives: Anti-infectives    None      Assessment Principal Problem:   Epigastric abdominal pain due to  malfunction intragastric balloon-balloon removed; multiple ulcerations noted in stomach-being treated with Protonix and Carafate    LOS: 2 days   Plan: Start with full liquid diet, advance to carb modified as tolerated.  Change Protonix to oral.  Dietary consult for low carbohydrate, lowfat diet.     ROSENBOWER,TODD J 11/11/2014

## 2014-11-12 ENCOUNTER — Encounter (HOSPITAL_COMMUNITY): Payer: Self-pay | Admitting: General Surgery

## 2014-11-12 LAB — GLUCOSE, CAPILLARY
GLUCOSE-CAPILLARY: 101 mg/dL — AB (ref 65–99)
Glucose-Capillary: 112 mg/dL — ABNORMAL HIGH (ref 65–99)

## 2014-11-12 MED ORDER — SUCRALFATE 1 GM/10ML PO SUSP: 1.0000 g | Freq: Three times a day (TID) | ORAL | Status: AC

## 2014-11-12 MED ORDER — PANTOPRAZOLE SODIUM 40 MG PO TBEC: 40.0000 mg | DELAYED_RELEASE_TABLET | Freq: Two times a day (BID) | ORAL | Status: AC

## 2014-11-12 NOTE — Progress Notes (Signed)
Patient discharged home, all discharge medications and instructions reviewed and questions answered. Patient to be assisted to vehicle by wheelchair.  

## 2014-11-12 NOTE — Discharge Summary (Signed)
Physician Discharge Summary  Jessica Sexton ZOX:[id number] DOB: [birth year] DOA: 11/08/2014  PCP: No primary care Jessica Sexton on file.  Admit date: 11/08/2014 Discharge date: 11/12/2014  Time spent: 35 minutes  Recommendations for Outpatient Follow-up:  1. Patient undergoing removal of gastric balloon during this hospitalization, please follow-up on CBC on hospital follow-up visit   Discharge Diagnoses:  Principal Problem:   Epigastric abdominal pain Active Problems:   Abdominal pain   Discharge Condition: Stable  Diet recommendation: Regular diet  Filed Weights   11/08/14 1826 11/09/14 0208  Weight: 66 kg (145 lb 8.1 oz) 71.5 kg (157 lb 10.1 oz)    History of present illness:  Jessica Sexton is a 29 y.o. female with no significant past medical history who has had previous history of placement of gastric balloon for obesity 2 years ago at Jessica Sexton presents to the ER because of abdominal pain over the last 2 weeks which has gradually worsened. Pain is mostly in the epigastric area with nausea and vomiting and unable to eat much. CT abdomen and pelvis shows a large balloon compressing on adjacent structures. On-call surgeon Dr. Gerrit Friends was consulted by ER physician and will be seeing patient in consult. Patient's pain is improved with Dilaudid. Patient otherwise denies any diarrhea or fever chills.  Hospital Course:  Patient is a pleasant 29 year old with a past medical history of gastric balloon placement done 2 years ago and Jessica Sexton, history of morbid obesity, admitted to the medicine service on 11/09/2014 presented with complaints of worsening abdominal pain associated with nausea and vomiting. GI symptoms felt to be secondary to mass effect on adjacent structures from gastric tube. General surgery and GI were consulted during this hospitalization. On 11/10/2014 she underwent endoscopic removal of gastric balloon, procedure was performed by Dr. Sheliah Hatch of bariatric surgery.  Endoscopic operative time was 130 minutes, she tolerated procedure well there no immediate complications. Postoperatively her diet was advanced which she tolerated well. By the morning of 11/12/2014 she denied fevers, chills, further episodes of nausea, vomiting, abdominal pain. She denied having bright red blood per rectum or melanoma. She was discharged to her home in stable condition on this date.  Procedures:  Status post endoscopic removal of gastric balloon, procedure performed 11/10/2014  Consultations:  GI  Surgery  Discharge Exam: Filed Vitals:   11/12/14 0603  BP: 140/99  Pulse: 68  Temp: 98.3 F (36.8 C)  Resp: 16    General: Patient is in no acute distress, awake and alert Cardiovascular: Regular rate and rhythm normal S1-S2 no murmurs rubs or gallops Respiratory: Normal respiratory effort, lungs are clear to auscultation bilaterally Abdomen: Soft nontender nondistended positive bowel sounds Extremities: No edema  Discharge Instructions   Discharge Instructions    Call MD for:  difficulty breathing, headache or visual disturbances    Complete by:  As directed      Call MD for:  extreme fatigue    Complete by:  As directed      Call MD for:  hives    Complete by:  As directed      Call MD for:  persistant dizziness or light-headedness    Complete by:  As directed      Call MD for:  persistant nausea and vomiting    Complete by:  As directed      Call MD for:  redness, tenderness, or signs of infection (pain, swelling, redness, odor or green/yellow discharge around incision site)    Complete by:  As directed  Call MD for:  severe uncontrolled pain    Complete by:  As directed      Call MD for:  temperature >100.4    Complete by:  As directed      Call MD for:    Complete by:  As directed      Diet - low sodium heart healthy    Complete by:  As directed      Increase activity slowly    Complete by:  As directed           Current Discharge  Medication List    START taking these medications   Details  sucralfate (CARAFATE) 1 GM/10ML suspension Take 10 mLs (1 g total) by mouth 4 (four) times daily -  with meals and at bedtime. Qty: 420 mL, Refills: 0      CONTINUE these medications which have CHANGED   Details  pantoprazole (PROTONIX) 40 MG tablet Take 1 tablet (40 mg total) by mouth 2 (two) times daily. Qty: 60 tablet, Refills: 0      CONTINUE these medications which have NOT CHANGED   Details  metoCLOPramide (REGLAN) 10 MG tablet Take 10 mg by mouth every 6 (six) hours as needed for nausea or vomiting.       No Known Allergies Follow-up Information    Follow up with Rodman Pickle, MD In 2 weeks.   Specialty:  General Surgery   Contact information:   [address] [place] [address] [place] [address] [phone]        The results of significant diagnostics from this hospitalization (including imaging, microbiology, ancillary and laboratory) are listed below for reference.    Significant Diagnostic Studies: Ct Abdomen Pelvis W Contrast  11/09/2014   CLINICAL DATA:  29 year old female. Epigastric pain and nausea x2 weeks. Patient reports he gastric balloon for weight loss.  EXAM: CT ABDOMEN AND PELVIS WITH CONTRAST  TECHNIQUE: Multidetector CT imaging of the abdomen and pelvis was performed using the standard protocol following bolus administration of intravenous contrast.  CONTRAST:  OMNIPAQUE IOHEXOL 300 MG/ML  SOLN  COMPARISON:  None.  FINDINGS: The visualized lung bases are clear. No intra-abdominal free air. Trace free fluid within the pelvis.  The liver, gallbladder, pancreas, spleen, adrenal glands appear unremarkable. 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 The right kidney is mildly enlarged with lobulated contour. This may be related to compensatory hypertrophy. The crossed fused ectopia is less likely. There is mild right hydronephrosis. The visualized right ureter appears  unremarkable. 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 The urinary bladder is unremarkable. The uterus is not visualized and may be surgically absent.  A gastric balloon is noted within the distal stomach. Contrast however is noted passing through the stomach into the small bowel without evidence of obstruction. There is marked distention of the gastric balloon with air fluid level. There is distention of the stomach with compression of the adjacent small bowel and mesenteric vasculature. Copious amount of stool noted throughout the colon. The appendix is unremarkable.  The visualized abdominal aorta and IVC appear unremarkable. No portal venous gas identified. There is no lymphadenopathy. An ovoid soft tissue density containing interspersed fat noted extending into the right inguinal canal. There is no definite associated inflammatory changes. Ultrasound may provide better evaluation of this tissue and right inguinal hernia.  Midline vertical anterior pelvic wall incisional scar is noted. There is  mild diffuse subcutaneous soft tissue stranding. The osseous structures are grossly unremarkable.  IMPRESSION: Marked distention of the gastric balloon with distention of the stomach and mass-effect on the adjacent small bowel and mesentery. No evidence of bowel obstruction. Constipation. Normal appendix.  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 There is mild fullness of the right renal collecting system and pelvis.  Right inguinal hernia containing an ovoid soft tissue with interspersed fat. Correlation with clinical exam and ultrasound recommended.   Electronically Signed   By: Elgie Collard M.D.   On: 11/09/2014 00:06    Microbiology: Recent Results (from the past 240 hour(s))  Surgical PCR screen     Status: None   Collection Time: 11/10/14  1:56 PM  Result Value Ref Range Status    MRSA, PCR NEGATIVE NEGATIVE Final   Staphylococcus aureus NEGATIVE NEGATIVE Final    Comment:        The Xpert SA Assay (FDA approved for NASAL specimens in patients over 50 years of age), is one component of a comprehensive surveillance program.  Test performance has been validated by [facility] for patients greater than or equal to 20 year old. It is not intended to diagnose infection nor to guide or monitor treatment.      Labs: Basic Metabolic Panel:  Recent Labs Lab 11/08/14 2115 11/09/14 0600 11/11/14 0424  NA 140 139 140  K 4.1 3.9 4.3  CL 103 108 109  CO2  --  24 23  GLUCOSE 86 92 104*  BUN CREATININE 0.90 0.82 0.72  CALCIUM  --  8.6* 8.5*   Liver Function Tests:  Recent Labs Lab 11/08/14 2107 11/09/14 0600  AST 16 10*  ALT 12* 9*  ALKPHOS 105 83  BILITOT 0.7 0.8  PROT 7.6 5.9*  ALBUMIN 4.0 3.1*    Recent Labs Lab 11/08/14 2107  LIPASE 16*   No results for input(s): AMMONIA in the last 168 hours. CBC:  Recent Labs Lab 11/08/14 1914 11/08/14 2115 11/09/14 0600 11/11/14 0424  WBC 5.8  --  5.9 5.1  NEUTROABS  --   --  2.0  --   HGB 15.3* 16.3* 12.8 13.2  HCT 43.7 48.0* 38.0 38.2  MCV 80.8  --  81.4 80.9  PLT 298  --  288 330   Cardiac Enzymes: No results for input(s): CKTOTAL, CKMB, CKMBINDEX, TROPONINI in the last 168 hours. BNP: BNP (last 3 results) No results for input(s): BNP in the last 8760 hours.  ProBNP (last 3 results) No results for input(s): PROBNP in the last 8760 hours.  CBG:  Recent Labs Lab 11/10/14 1818 11/11/14 0553 11/11/14 1151 11/11/14 1714 11/11/14 2345  GLUCAP 106* 84 71 92 89       Signed:  ZAMORA, EZEQUIEL  [facility] 11/12/2014, 7:18 AM

## 2014-11-12 NOTE — Plan of Care (Signed)
Problem: Food- and Nutrition-Related Knowledge Deficit (NB-1.1) Goal: Nutrition education Formal process to instruct or train a patient/client in a skill or to impart knowledge to help patients/clients voluntarily manage or modify food choices and eating behavior to maintain or improve health. Outcome: Completed/Met Date Met:  11/12/14 Nutrition Education Note  Received consult for diet education for patient s/p gastric balloon removal.  Pt with no previous diet education regarding weight loss surgery. Pt has been eating anything she wanted for the past 2 years. Pt currently drinking Pepsi and eating a sandwich. Reviewed "Bariatric Liquid Protein Supplements" and "Bariatric Surgery Soft Protein-Rich foods Nutrition Therapy" handouts from [employer].  Emphasized that liquids must be non carbonated, non caffeinated, and sugar free. Protein and fluid goals discussed.  Teach back method used, pt expressed understanding, expect poor to fair compliance. Per pt's mother, pt is not willing to make changes and is "lazy".   Clayton Bibles, MS, RD, LDN Pager: [phone] After Hours Pager: [phone]

## 2014-11-12 NOTE — Progress Notes (Signed)
2 Days Post-Op  Subjective: No pain, she has had a BM and she feels fine this AM.  Objective: Vital signs in last 24 hours: Temp:  [98.3 F (36.8 C)-99.3 F (37.4 C)] 98.3 F (36.8 C) (09/19 0603) Pulse Rate:  [58-68] 68 (09/19 0603) Resp:  [16] 16 (09/19 0603) BP: (127-140)/(70-99) 140/99 mmHg (09/19 0603) SpO2:  [100 %] 100 % (09/19 0603) Last BM Date: 11/06/14 480 PO REgular diet Afebrile, VSS NO labs today Intake/Output from previous day: 09/18 0701 - 09/19 0700 In: 2349.5 [P.O.:480; I.V.:1869.5] Out: -  Intake/Output this shift:    General appearance: alert, cooperative and no distress GI: soft, non-tender; bowel sounds normal; no masses,  no organomegaly  Lab Results:   Recent Labs  11/11/14 0424  WBC 5.1  HGB 13.2  HCT 38.2  PLT 330    BMET  Recent Labs  11/11/14 0424  NA 140  K 4.3  CL 109  CO2 23  GLUCOSE 104*  BUN 6  CREATININE 0.72  CALCIUM 8.5*   PT/INR No results for input(s): LABPROT, INR in the last 72 hours.   Recent Labs Lab 11/08/14 2107 11/09/14 0600  AST 16 10*  ALT 12* 9*  ALKPHOS 105 83  BILITOT 0.7 0.8  PROT 7.6 5.9*  ALBUMIN 4.0 3.1*     Lipase     Component Value Date/Time   LIPASE 16* 11/08/2014 2107     Studies/Results: No results found.  Medications: . pantoprazole  40 mg Oral BID  . sucralfate  1 g Oral TID WC & HS    Assessment/Plan malfunction intragastric balloon S/p endoscopic retrieval of intragastric balloon, 11/10/14, Dr. Feliciana Rossetti Antibiotics:  None DVT: SCD's only   Plan:  I have ask Dietician to see and teach a low carb bariatric diet.  She has instructions to call for follow up with Dr. Sheliah Hatch in 2 weeks.    LOS: 3 days    JENNINGS,WILLARD 11/12/2014

## 2014-11-13 ENCOUNTER — Encounter (HOSPITAL_COMMUNITY): Payer: Self-pay | Admitting: General Surgery

## 2016-05-22 IMAGING — CT CT ABD-PELV W/ CM
2 of 4 series · 15 of 46 positions shown, 17 images · IV contrast (OMNIPAQUE 300)
Comparison: None.

CLINICAL DATA: 28-year-old female. Epigastric pain and nausea x2
weeks. Patient reports he gastric balloon for weight loss.

EXAM:
CT ABDOMEN AND PELVIS WITH CONTRAST
TECHNIQUE: Multidetector CT imaging of the abdomen and pelvis was performed
using the standard protocol following bolus administration of
intravenous contrast.
CONTRAST:  100mL OMNIPAQUE IOHEXOL 300 MG/ML  SOLN

[Series 2: abd/pel with · axial · 0.63mm/px · z∈[-861,-501]mm · 12 of 83 slices shown, 14 images]
[im 7/83  soft-tissue]
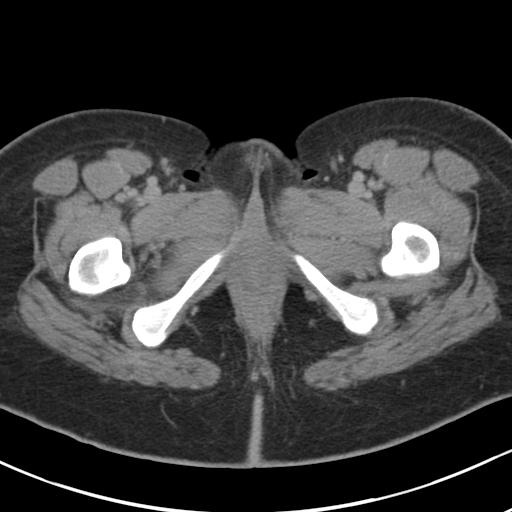
[im 7/83  bone]
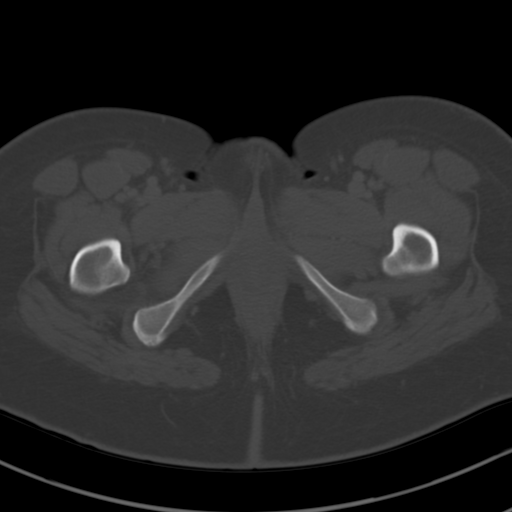
[im 14/83  soft-tissue]
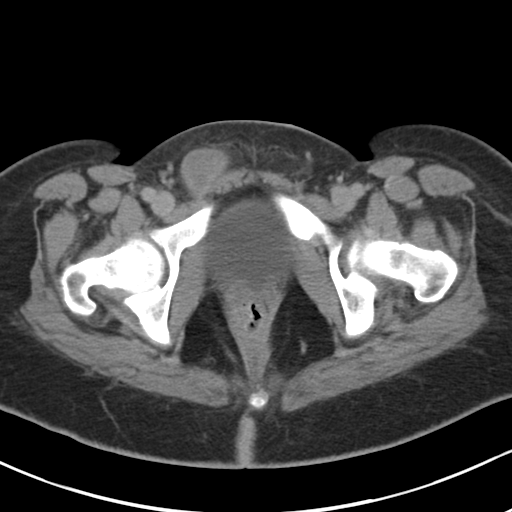
[im 20/83  soft-tissue]
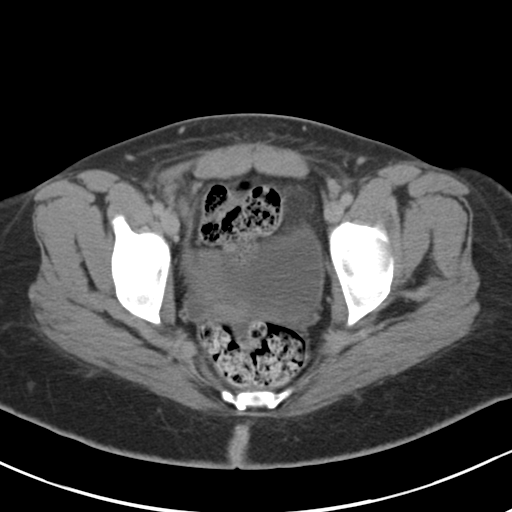
[im 27/83  soft-tissue]
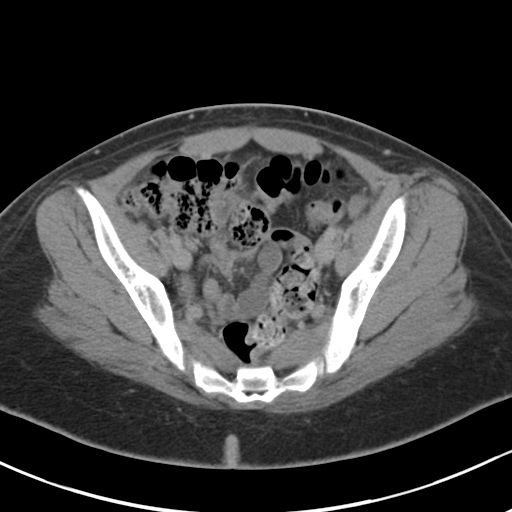
[im 33/83  soft-tissue]
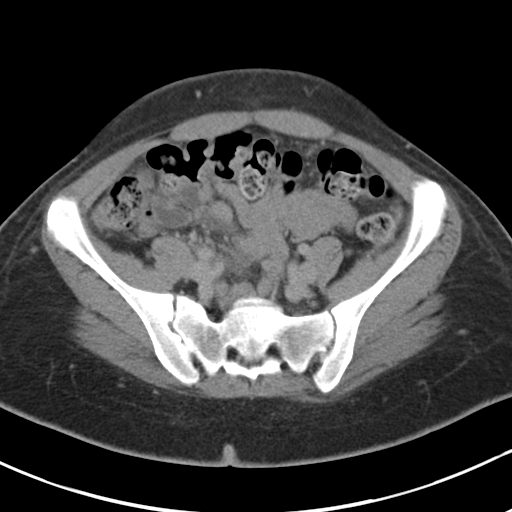
[im 40/83  soft-tissue]
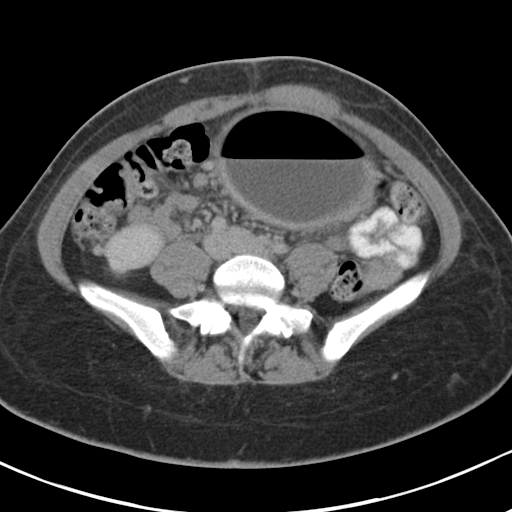
[im 46/83  soft-tissue]
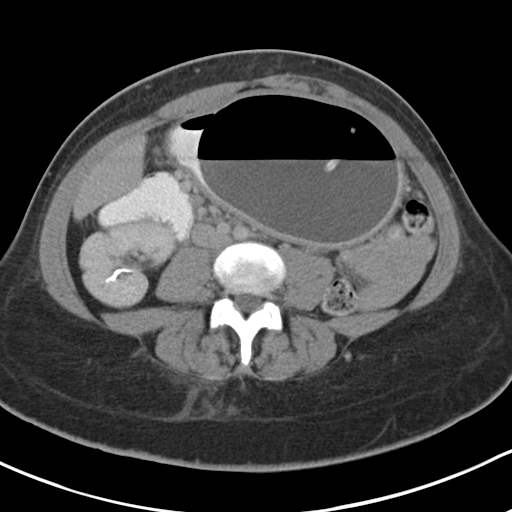
[im 53/83  soft-tissue]
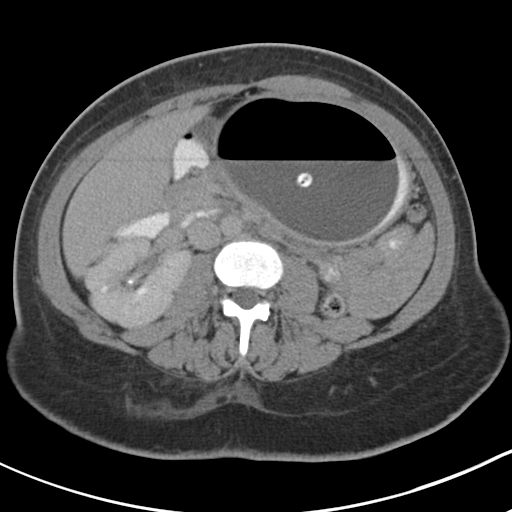
[im 60/83  soft-tissue]
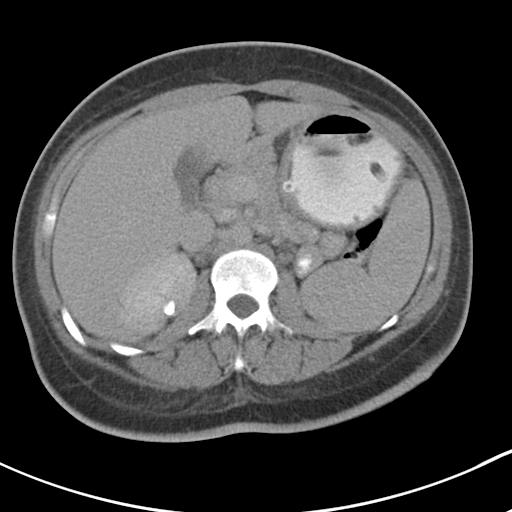
[im 60/83  bone]
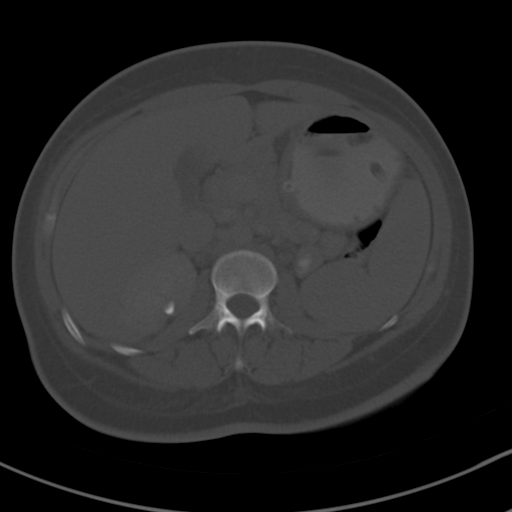
[im 66/83  soft-tissue]
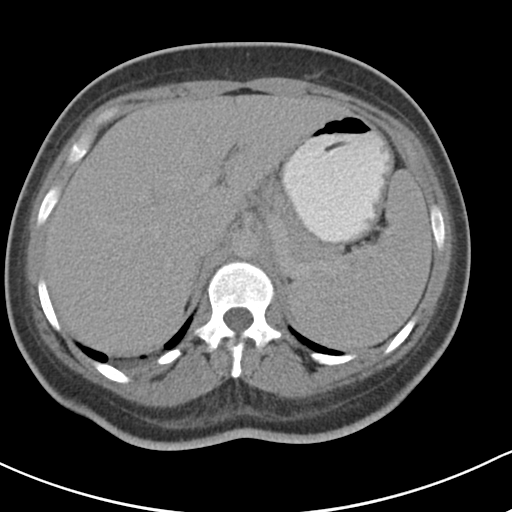
[im 73/83  soft-tissue]
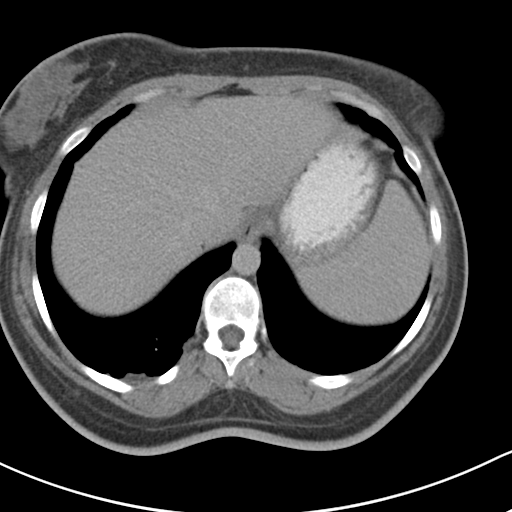
[im 79/83  soft-tissue]
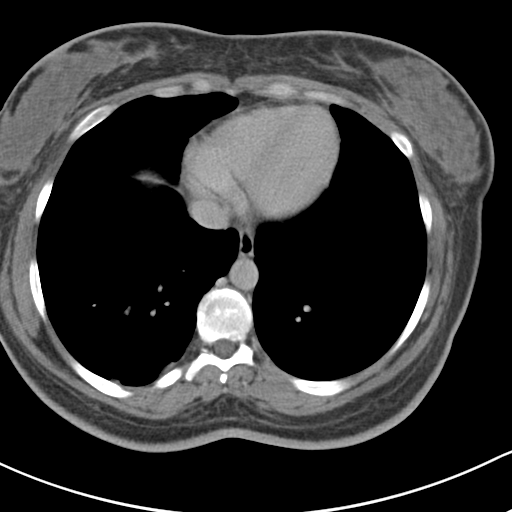

[Series 3: coronal a/|p · coronal · 0.64mm/px · 3 of 92 slices shown]
[im 31/92  soft-tissue]
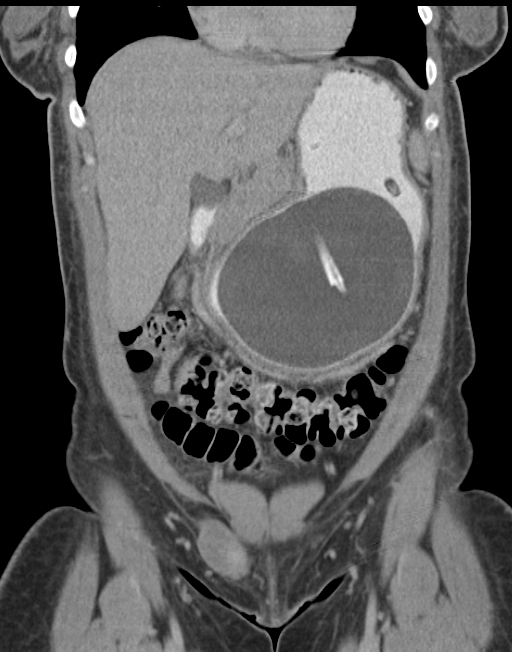
[im 41/92  soft-tissue]
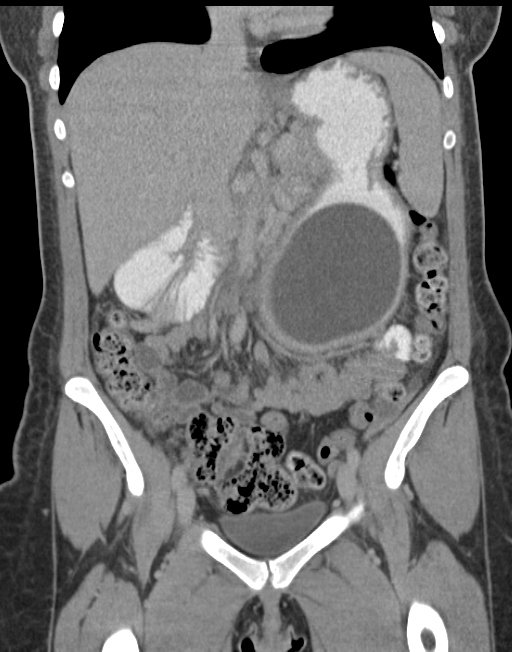
[im 51/92  soft-tissue]
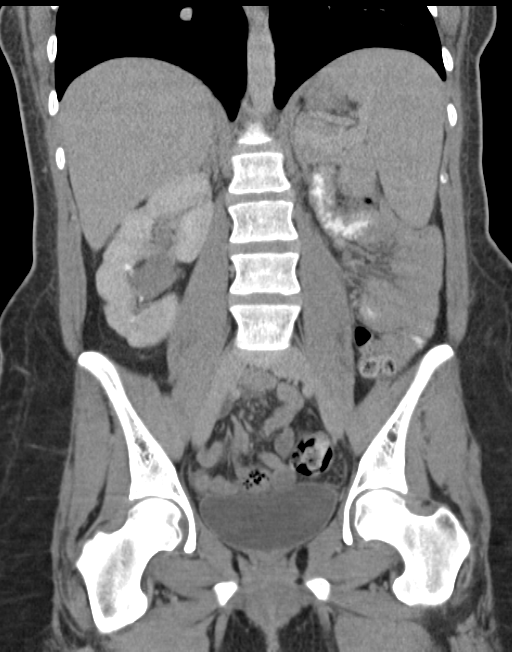

[15 of 46 positions shown; findings below may reference images not displayed]

FINDINGS: The visualized lung bases are clear. No intra-abdominal free air.
Trace free fluid within the pelvis.

The liver, gallbladder, pancreas, spleen, adrenal glands appear
unremarkable. The left kidney is not visualized, likely congenitally
absent. The right kidney is mildly enlarged with lobulated contour.
This may be related to compensatory hypertrophy. The crossed fused
ectopia is less likely. There is mild right hydronephrosis. The
visualized right ureter appears unremarkable. There is a small
outpouching of the bladder adjacent to the insertion of the right
ureter, likely Nuno Augusto Faustino diverticulum. The urinary bladder is
unremarkable. The uterus is not visualized and may be surgically
absent.

A gastric balloon is noted within the distal stomach. Contrast
however is noted passing through the stomach into the small bowel
without evidence of obstruction. There is marked distention of the
gastric balloon with air fluid level. There is distention of the
stomach with compression of the adjacent small bowel and mesenteric
vasculature. Copious amount of stool noted throughout the colon. The
appendix is unremarkable.

The visualized abdominal aorta and IVC appear unremarkable. No
portal venous gas identified. There is no lymphadenopathy. An ovoid
soft tissue density containing interspersed fat noted extending into
the right inguinal canal. There is no definite associated
inflammatory changes. Ultrasound may provide better evaluation of
this tissue and right inguinal hernia.

Midline vertical anterior pelvic wall incisional scar is noted.
There is mild diffuse subcutaneous soft tissue stranding. The
osseous structures are grossly unremarkable.
IMPRESSION: Marked distention of the gastric balloon with distention of the
stomach and mass-effect on the adjacent small bowel and mesentery.
No evidence of bowel obstruction. Constipation. Normal appendix.

Solitary right kidney with apparent compensatory hypertrophy and
cortical lobularity. There is mild fullness of the right renal
collecting system and pelvis.

Right inguinal hernia containing an ovoid soft tissue with
interspersed fat. Correlation with clinical exam and ultrasound
recommended.

## 2019-02-02 ENCOUNTER — Other Ambulatory Visit: Payer: Self-pay

## 2019-02-02 DIAGNOSIS — Z20822 Contact with and (suspected) exposure to covid-19: Secondary | ICD-10-CM

## 2019-02-04 LAB — NOVEL CORONAVIRUS, NAA: SARS-CoV-2, NAA: NOT DETECTED

## 2019-03-02 ENCOUNTER — Other Ambulatory Visit: Payer: Self-pay

## 2019-03-03 ENCOUNTER — Ambulatory Visit: Payer: Self-pay | Attending: Internal Medicine

## 2019-03-03 DIAGNOSIS — Z20822 Contact with and (suspected) exposure to covid-19: Secondary | ICD-10-CM

## 2019-03-05 LAB — NOVEL CORONAVIRUS, NAA: SARS-CoV-2, NAA: NOT DETECTED

## 2019-07-15 ENCOUNTER — Ambulatory Visit: Payer: Self-pay | Attending: Internal Medicine

## 2019-07-15 ENCOUNTER — Other Ambulatory Visit: Payer: Self-pay

## 2019-07-15 DIAGNOSIS — Z23 Encounter for immunization: Secondary | ICD-10-CM

## 2019-07-15 NOTE — Progress Notes (Signed)
   Covid-[facility] Vaccination [facility]  Name:  Jessica Sexton    MRN: [id number] DOB: May 13, 1985  07/15/2019  [name] was observed post Covid-19 immunization for 15 minutes without incident. She was provided with Vaccine Information Sheet and instruction to access the V-Safe system.   [name] was instructed to call 911 with any severe reactions post vaccine: Marland Kitchen Difficulty breathing  . Swelling of face and throat  . A fast heartbeat  . A bad rash all over body  . Dizziness and weakness   Immunizations Administered    Name Date Dose VIS Date Route   Pfizer COVID-19 Vaccine 07/15/2019  9:39 AM 0.3 mL 04/19/2018 Intramuscular   Manufacturer: [employer], [employer]   Lot: [id number]   NDC: [address]-7

## 2019-07-17 ENCOUNTER — Ambulatory Visit: Payer: Self-pay

## 2019-08-07 ENCOUNTER — Ambulatory Visit: Payer: Self-pay | Attending: Internal Medicine

## 2019-08-25 ENCOUNTER — Ambulatory Visit: Payer: HRSA Program | Attending: Internal Medicine

## 2019-08-25 DIAGNOSIS — Z20822 Contact with and (suspected) exposure to covid-19: Secondary | ICD-10-CM | POA: Insufficient documentation

## 2019-08-25 DIAGNOSIS — Z23 Encounter for immunization: Secondary | ICD-10-CM | POA: Insufficient documentation

## 2019-08-26 LAB — NOVEL CORONAVIRUS, NAA: SARS-CoV-2, NAA: NOT DETECTED

## 2019-08-26 LAB — SARS-COV-2, NAA 2 DAY TAT
